# Patient Record
Sex: Female | Born: 1990 | Race: White | Hispanic: No | Marital: Married | Smoking: Never smoker
Health system: Southern US, Community
[De-identification: ages and names within clinical notes are randomized; demographics above are authoritative.]

## PROBLEM LIST (undated history)

## (undated) ENCOUNTER — Inpatient Hospital Stay (HOSPITAL_COMMUNITY): Payer: Self-pay

## (undated) DIAGNOSIS — E0590 Thyrotoxicosis, unspecified without thyrotoxic crisis or storm: Secondary | ICD-10-CM

## (undated) DIAGNOSIS — D649 Anemia, unspecified: Secondary | ICD-10-CM

## (undated) DIAGNOSIS — Z9889 Other specified postprocedural states: Secondary | ICD-10-CM

## (undated) DIAGNOSIS — F419 Anxiety disorder, unspecified: Secondary | ICD-10-CM

## (undated) DIAGNOSIS — E039 Hypothyroidism, unspecified: Secondary | ICD-10-CM

## (undated) DIAGNOSIS — B999 Unspecified infectious disease: Secondary | ICD-10-CM

## (undated) DIAGNOSIS — R112 Nausea with vomiting, unspecified: Secondary | ICD-10-CM

## (undated) DIAGNOSIS — R569 Unspecified convulsions: Secondary | ICD-10-CM

## (undated) DIAGNOSIS — F329 Major depressive disorder, single episode, unspecified: Secondary | ICD-10-CM

## (undated) DIAGNOSIS — N730 Acute parametritis and pelvic cellulitis: Secondary | ICD-10-CM

## (undated) DIAGNOSIS — M797 Fibromyalgia: Secondary | ICD-10-CM

## (undated) DIAGNOSIS — F32A Depression, unspecified: Secondary | ICD-10-CM

## (undated) DIAGNOSIS — IMO0002 Reserved for concepts with insufficient information to code with codable children: Secondary | ICD-10-CM

## (undated) HISTORY — PX: ROTATOR CUFF REPAIR: SHX139

## (undated) HISTORY — PX: DILATION AND CURETTAGE OF UTERUS: SHX78

## (undated) HISTORY — PX: TONSILLECTOMY: SUR1361

---

## 2012-02-18 NOTE — L&D Delivery Note (Signed)
Delivery Note  At 8:25 AM a viable femalenamed Rachel Bartlett  was delivered via Vaginal, Spontaneous Delivery (Presentation: Right Occiput Anterior).  APGAR: 9, 9 Placenta status: Intact, Spontaneous.  Cord: 3 vessels with the following complications: None.   Anesthesia: Epidural  Episiotomy: None Lacerations: None Est. Blood Loss (mL): 300  Mom to postpartum.  Baby to Couplet care / Skin to Skin.  [name] A 02/08/2013, 8:48 AM

## 2012-06-15 LAB — OB RESULTS CONSOLE RUBELLA ANTIBODY, IGM: Rubella: IMMUNE

## 2012-06-15 LAB — OB RESULTS CONSOLE RPR: RPR: NONREACTIVE

## 2012-06-15 LAB — OB RESULTS CONSOLE HEPATITIS B SURFACE ANTIGEN: Hepatitis B Surface Ag: NEGATIVE

## 2012-06-15 LAB — OB RESULTS CONSOLE HIV ANTIBODY (ROUTINE TESTING): HIV: NONREACTIVE

## 2012-06-15 LAB — OB RESULTS CONSOLE ANTIBODY SCREEN: Antibody Screen: NEGATIVE

## 2012-06-15 LAB — OB RESULTS CONSOLE GC/CHLAMYDIA: Chlamydia: NEGATIVE

## 2012-07-26 ENCOUNTER — Encounter (HOSPITAL_COMMUNITY): Payer: Self-pay

## 2012-07-26 ENCOUNTER — Inpatient Hospital Stay (HOSPITAL_COMMUNITY)
Admission: AD | Admit: 2012-07-26 | Discharge: 2012-07-26 | Disposition: A | Discharge status: Home / Self Care | Payer: Medicaid Other | Source: Ambulatory Visit | Attending: Obstetrics and Gynecology | Admitting: Obstetrics and Gynecology

## 2012-07-26 DIAGNOSIS — R197 Diarrhea, unspecified: Secondary | ICD-10-CM | POA: Insufficient documentation

## 2012-07-26 DIAGNOSIS — R51 Headache: Secondary | ICD-10-CM | POA: Insufficient documentation

## 2012-07-26 DIAGNOSIS — O210 Mild hyperemesis gravidarum: Secondary | ICD-10-CM | POA: Insufficient documentation

## 2012-07-26 DIAGNOSIS — K5900 Constipation, unspecified: Secondary | ICD-10-CM | POA: Insufficient documentation

## 2012-07-26 DIAGNOSIS — O99891 Other specified diseases and conditions complicating pregnancy: Secondary | ICD-10-CM | POA: Insufficient documentation

## 2012-07-26 HISTORY — DX: Reserved for concepts with insufficient information to code with codable children: IMO0002

## 2012-07-26 HISTORY — DX: Thyrotoxicosis, unspecified without thyrotoxic crisis or storm: E05.90

## 2012-07-26 HISTORY — DX: Anxiety disorder, unspecified: F41.9

## 2012-07-26 HISTORY — DX: Fibromyalgia: M79.7

## 2012-07-26 HISTORY — DX: Hypothyroidism, unspecified: E03.9

## 2012-07-26 HISTORY — DX: Anemia, unspecified: D64.9

## 2012-07-26 HISTORY — DX: Major depressive disorder, single episode, unspecified: F32.9

## 2012-07-26 HISTORY — DX: Depression, unspecified: F32.A

## 2012-07-26 HISTORY — DX: Acute parametritis and pelvic cellulitis: N73.0

## 2012-07-26 HISTORY — DX: Other specified postprocedural states: Z98.890

## 2012-07-26 HISTORY — DX: Unspecified infectious disease: B99.9

## 2012-07-26 HISTORY — DX: Unspecified convulsions: R56.9

## 2012-07-26 HISTORY — DX: Nausea with vomiting, unspecified: R11.2

## 2012-07-26 LAB — COMPREHENSIVE METABOLIC PANEL
ALT: 9 U/L (ref 0–35)
AST: 12 U/L (ref 0–37)
Albumin: 3.7 g/dL (ref 3.5–5.2)
Alkaline Phosphatase: 41 U/L (ref 39–117)
CO2: 24 mEq/L (ref 19–32)
Chloride: 103 mEq/L (ref 96–112)
Creatinine, Ser: 0.5 mg/dL (ref 0.50–1.10)
GFR calc non Af Amer: >90 mL/min (ref >90)
Potassium: 3.6 mEq/L (ref 3.5–5.1)
Total Bilirubin: 0.3 mg/dL (ref 0.3–1.2)

## 2012-07-26 LAB — URINALYSIS, ROUTINE W REFLEX MICROSCOPIC
Glucose, UA: NEGATIVE mg/dL
Nitrite: NEGATIVE
Protein, ur: NEGATIVE mg/dL
pH: 6.5 (ref 5.0–8.0)

## 2012-07-26 LAB — CBC WITH DIFFERENTIAL/PLATELET
Basophils Absolute: 0 10*3/uL (ref 0.0–0.1)
HCT: 35.9 % — ABNORMAL LOW (ref 36.0–46.0)
Hemoglobin: 12.9 g/dL (ref 12.0–15.0)
Lymphocytes Relative: 26 % (ref 12–46)
Monocytes Absolute: 0.5 10*3/uL (ref 0.1–1.0)
Neutro Abs: 5.3 10*3/uL (ref 1.7–7.7)
Neutrophils Relative %: 68 % (ref 43–77)
RDW: 12.6 % (ref 11.5–15.5)
WBC: 7.8 10*3/uL (ref 4.0–10.5)

## 2012-07-26 LAB — URINE MICROSCOPIC-ADD ON

## 2012-07-26 MED ORDER — ACETAMINOPHEN 325 MG PO TABS
650.0000 mg | ORAL_TABLET | Freq: Once | ORAL | Status: AC
  Administered 2012-07-26: 650 mg via ORAL
  Filled 2012-07-26: qty 2

## 2012-07-26 MED ORDER — ONDANSETRON HCL 4 MG/2ML IJ SOLN
4.0000 mg | Freq: Once | INTRAMUSCULAR | Status: AC
  Administered 2012-07-26: 4 mg via INTRAVENOUS
  Filled 2012-07-26: qty 2

## 2012-07-26 MED ORDER — LACTATED RINGERS IV BOLUS (SEPSIS)
500.0000 mL | Freq: Once | INTRAVENOUS | Status: AC
  Administered 2012-07-26: 18:00:00 via INTRAVENOUS

## 2012-07-26 MED ORDER — LACTATED RINGERS IV SOLN
INTRAVENOUS | Status: DC
  Administered 2012-07-26: 18:00:00 via INTRAVENOUS

## 2012-07-26 NOTE — MAU Provider Note (Signed)
History   22 yo G3P1011 at 41 5/7 weeks presented after calling office with N/V/D and HA since 07/24/12.  Hx of IBS and chronic constipation--on Miralax last week.  Denies fever or viral exposure.    Chief Complaint  Patient presents with  . Emesis  . Diarrhea  . Headache  . Abdominal Pain   OB History   Grav Para Term Preterm Abortions TAB SAB Ect Mult Living   3 1 1  0 1 0 1 0 0 1      Past Medical History  Diagnosis Date  . PONV (postoperative nausea and vomiting)   . Seizures     psuedo seizures  . Anemia   . Hyperthyroidism   . Hypothyroidism   . Infection     UTI  . Fibromyalgia   . Anxiety   . Depression   . PID (acute pelvic inflammatory disease)   . Abnormal Pap smear     HPV    Past Surgical History  Procedure Laterality Date  . Dilation and curettage of uterus    . Rotator cuff repair      right    Family History  Problem Relation Age of Onset  . Diabetes Maternal Grandmother   . Heart disease Maternal Grandmother   . Hyperlipidemia Maternal Grandmother   . Stroke Maternal Grandmother   . Hypertension Maternal Grandmother   . Cancer Maternal Grandmother     throat  . Cancer Maternal Grandfather     stomach  . Hyperlipidemia Maternal Grandfather   . Stroke Maternal Grandfather   . Dementia Maternal Grandfather     History  Substance Use Topics  . Smoking status: Never Smoker   . Smokeless tobacco: Never Used  . Alcohol Use: No    Allergies:  Allergies  Allergen Reactions  . Shellfish Allergy Anaphylaxis, Shortness Of Breath, Nausea And Vomiting and Swelling  . Celexa (Citalopram Hydrobromide) Hives, Swelling and Other (See Comments)    Causes hairloss  . Codeine Hives, Swelling and Other (See Comments)    Causes hairloss    Prescriptions prior to admission  Medication Sig Dispense Refill  . ondansetron (ZOFRAN-ODT) 4 MG disintegrating tablet Take 4-8 mg by mouth every 8 (eight) hours as needed for nausea.      . polyethylene glycol  (MIRALAX / GLYCOLAX) packet Take 17 g by mouth daily as needed (For constipation.).         Physical Exam   Blood pressure 112/64, pulse 110, temperature 98.9 F (37.2 C), temperature source Oral, resp. rate 16, height 5\' 6"  (1.676 m), weight 147 lb 3.2 oz (66.769 kg), SpO2 100.00%.  Chest clear  Heart RRR without murmur Abd gravid, NT Pelvic--deferred Ext WNL  FHR 165 by doppler.   ED Course  IUP at 11 5/7 weeks N/V/D, hx IBS  Plan: IV hydration Zofran CBC, diff, CMP UA Sanda Klein, CNM, will f/u on pt.   Nigel Bridgeman CNM, [place] 07/26/2012 6:46 PM

## 2012-07-26 NOTE — MAU Note (Signed)
Patient states she has had multiple episodes of nausea, vomiting and diarrhea since 6-7. Has been having all over abdominal pain and a headache. Denies bleeding or unusual discharge.

## 2012-07-28 LAB — URINE CULTURE
Colony Count: NO GROWTH
Culture: NO GROWTH

## 2012-09-08 ENCOUNTER — Encounter (HOSPITAL_COMMUNITY): Payer: Self-pay | Admitting: *Deleted

## 2012-09-08 ENCOUNTER — Inpatient Hospital Stay (HOSPITAL_COMMUNITY)
Admission: AD | Admit: 2012-09-08 | Discharge: 2012-09-09 | Disposition: A | Discharge status: Home / Self Care | Payer: Medicaid Other | Source: Ambulatory Visit | Attending: Obstetrics and Gynecology | Admitting: Obstetrics and Gynecology

## 2012-09-08 DIAGNOSIS — R51 Headache: Secondary | ICD-10-CM | POA: Insufficient documentation

## 2012-09-08 DIAGNOSIS — E860 Dehydration: Secondary | ICD-10-CM | POA: Insufficient documentation

## 2012-09-08 DIAGNOSIS — R509 Fever, unspecified: Secondary | ICD-10-CM | POA: Insufficient documentation

## 2012-09-08 DIAGNOSIS — O211 Hyperemesis gravidarum with metabolic disturbance: Secondary | ICD-10-CM | POA: Insufficient documentation

## 2012-09-08 LAB — URINALYSIS, ROUTINE W REFLEX MICROSCOPIC
Bilirubin Urine: NEGATIVE
Glucose, UA: NEGATIVE mg/dL
Hgb urine dipstick: NEGATIVE
Protein, ur: NEGATIVE mg/dL
Urobilinogen, UA: 0.2 mg/dL (ref 0.0–1.0)

## 2012-09-08 LAB — CBC WITH DIFFERENTIAL/PLATELET
Basophils Absolute: 0 10*3/uL (ref 0.0–0.1)
Eosinophils Absolute: 0 10*3/uL (ref 0.0–0.7)
Eosinophils Relative: 0 % (ref 0–5)
HCT: 35.1 % — ABNORMAL LOW (ref 36.0–46.0)
Lymphocytes Relative: 22 % (ref 12–46)
Lymphs Abs: 1.7 10*3/uL (ref 0.7–4.0)
MCH: 31.2 pg (ref 26.0–34.0)
MCV: 89.1 fL (ref 78.0–100.0)
Monocytes Absolute: 0.6 10*3/uL (ref 0.1–1.0)
Platelets: 189 10*3/uL (ref 150–400)
RDW: 12.9 % (ref 11.5–15.5)
WBC: 7.8 10*3/uL (ref 4.0–10.5)

## 2012-09-08 LAB — COMPREHENSIVE METABOLIC PANEL
CO2: 24 mEq/L (ref 19–32)
Calcium: 8.9 mg/dL (ref 8.4–10.5)
Creatinine, Ser: 0.5 mg/dL (ref 0.50–1.10)
GFR calc Af Amer: >90 mL/min (ref >90)
GFR calc non Af Amer: >90 mL/min (ref >90)
Glucose, Bld: 78 mg/dL (ref 70–99)
Sodium: 136 mEq/L (ref 135–145)
Total Protein: 5.5 g/dL — ABNORMAL LOW (ref 6.0–8.3)

## 2012-09-08 LAB — URINE MICROSCOPIC-ADD ON

## 2012-09-08 MED ORDER — PROMETHAZINE HCL 50 MG PO TABS: 25.0000 mg | ORAL_TABLET | Freq: Four times a day (QID) | ORAL | Status: DC | PRN

## 2012-09-08 MED ORDER — PROMETHAZINE HCL 25 MG PO TABS
25.0000 mg | ORAL_TABLET | Freq: Once | ORAL | Status: AC
  Administered 2012-09-08: 25 mg via ORAL
  Filled 2012-09-08: qty 1

## 2012-09-08 MED ORDER — IBUPROFEN 200 MG PO TABS: 600.0000 mg | ORAL_TABLET | Freq: Four times a day (QID) | ORAL | Status: DC | PRN

## 2012-09-08 MED ORDER — ONDANSETRON HCL 4 MG/2ML IJ SOLN
4.0000 mg | Freq: Once | INTRAMUSCULAR | Status: AC
  Administered 2012-09-08: 4 mg via INTRAVENOUS
  Filled 2012-09-08: qty 2

## 2012-09-08 MED ORDER — ACETAMINOPHEN 325 MG PO TABS
650.0000 mg | ORAL_TABLET | Freq: Once | ORAL | Status: AC
  Administered 2012-09-08: 650 mg via ORAL
  Filled 2012-09-08: qty 2

## 2012-09-08 MED ORDER — IBUPROFEN 800 MG PO TABS
800.0000 mg | ORAL_TABLET | Freq: Once | ORAL | Status: AC
  Administered 2012-09-08: 800 mg via ORAL
  Filled 2012-09-08 (×2): qty 1

## 2012-09-08 MED ORDER — LACTATED RINGERS IV BOLUS (SEPSIS)
1000.0000 mL | Freq: Once | INTRAVENOUS | Status: AC
  Administered 2012-09-08: 1000 mL via INTRAVENOUS

## 2012-09-08 NOTE — MAU Note (Signed)
Pt presents with complaints of a fever, headache, dizziness, nausea and vomiting on and off for a couple of weeks. Pt denies any bleeding or discharge.

## 2012-09-08 NOTE — MAU Provider Note (Signed)
History     CSN: [id number]  Arrival date and time: 09/08/12 1923   None     Chief Complaint  Patient presents with  . Fever  . Nausea  . Emesis  . Dizziness  . Headache   HPI Comments: Pt is a 21yo G3P1011 at 18wks that arrives to MAU unannounced w multiple c/o, including, N/V, headache, dizziness, back pain, shoulder pain. Denies any cramping or ctx, no bleeding or LOF, no discharge, no dysuria, has infrequent BM's (hx IBS), last BM 2-3 days ago. Last ate today around noon, had half a piece of pizza, and vomited, has been using zofran infrequently at home. Tried tylenol once yesterday. States she's had the back pain and and shoulder pain x2wks, (hx shoulder reconstruction) Reports she had a fever today that was 101. No further recurrence of fever. States ppl at work have been sick, but she's not sure with what. (works at [employer]).     Fever  This is a new problem. The current episode started today. The problem has been resolved. The maximum temperature noted was 101 to 101.9 F. The temperature was taken using an oral thermometer. Associated symptoms include headaches, nausea and vomiting. Pertinent negatives include no abdominal pain, chest pain, congestion, coughing, diarrhea, ear pain, muscle aches, rash, sore throat, urinary pain or wheezing. She has tried acetaminophen for the symptoms. Improvement on treatment: fever resolved   Emesis  This is a recurrent problem. The current episode started in the past 7 days. The problem occurs 2 to 4 times per day. The problem has been waxing and waning. The emesis has an appearance of stomach contents. The maximum temperature recorded prior to her arrival was 101 - 101.9 F. The fever has been present for less than 1 day. Associated symptoms include dizziness, a fever and headaches. Pertinent negatives include no abdominal pain, arthralgias, chest pain, chills, coughing, diarrhea, myalgias, sweats, URI or weight loss. Risk factors include ill  contacts (around persons w ?URI, etc ). Treatments tried: zofran. The treatment provided no relief.  Headache  This is a new problem. The current episode started yesterday. The problem occurs constantly. The problem has been waxing and waning. The pain does not radiate. The pain quality is similar to prior headaches. The pain is moderate. Associated symptoms include back pain, dizziness, a fever, nausea and vomiting. Pertinent negatives include no abdominal pain, abnormal behavior, anorexia, blurred vision, coughing, drainage, ear pain, eye pain, eye redness, eye watering, facial sweating, hearing loss, insomnia, loss of balance, muscle aches, neck pain, numbness, phonophobia, photophobia, rhinorrhea, scalp tenderness, seizures, sinus pressure, sore throat, swollen glands, tingling, tinnitus, visual change, weakness or weight loss. Nothing aggravates the symptoms. She has tried acetaminophen for the symptoms. The treatment provided no relief. There is no history of cancer, cluster headaches, hypertension, immunosuppression, migraine headaches, migraines in the family, obesity, pseudotumor cerebri, recent head traumas, sinus disease or TMJ.      Past Medical History  Diagnosis Date  . PONV (postoperative nausea and vomiting)   . Seizures     psuedo seizures  . Anemia   . Hyperthyroidism   . Hypothyroidism   . Infection     UTI  . Fibromyalgia   . Anxiety   . Depression   . PID (acute pelvic inflammatory disease)   . Abnormal Pap smear     HPV    Past Surgical History  Procedure Laterality Date  . Dilation and curettage of uterus    . Rotator cuff repair  right    Family History  Problem Relation Age of Onset  . Diabetes Maternal Grandmother   . Heart disease Maternal Grandmother   . Hyperlipidemia Maternal Grandmother   . Stroke Maternal Grandmother   . Hypertension Maternal Grandmother   . Cancer Maternal Grandmother     throat  . Cancer Maternal Grandfather     stomach   . Hyperlipidemia Maternal Grandfather   . Stroke Maternal Grandfather   . Dementia Maternal Grandfather     History  Substance Use Topics  . Smoking status: Never Smoker   . Smokeless tobacco: Never Used  . Alcohol Use: No    Allergies:  Allergies  Allergen Reactions  . Shellfish Allergy Anaphylaxis, Shortness Of Breath, Nausea And Vomiting and Swelling  . Celexa (Citalopram Hydrobromide) Hives, Swelling and Other (See Comments)    Causes hairloss  . Codeine Hives, Swelling and Other (See Comments)    Causes hairloss    Prescriptions prior to admission  Medication Sig Dispense Refill  . ondansetron (ZOFRAN-ODT) 4 MG disintegrating tablet Take 4-8 mg by mouth every 8 (eight) hours as needed for nausea.      . polyethylene glycol (MIRALAX / GLYCOLAX) packet Take 17 g by mouth daily as needed (For constipation.).        Review of Systems  Constitutional: Positive for fever. Negative for chills and weight loss.  HENT: Negative for hearing loss, ear pain, congestion, sore throat, rhinorrhea, neck pain, sinus pressure and tinnitus.   Eyes: Negative for blurred vision, photophobia, pain and redness.  Respiratory: Negative for cough and wheezing.   Cardiovascular: Negative for chest pain.  Gastrointestinal: Positive for nausea, vomiting and constipation. Negative for abdominal pain, diarrhea and anorexia.  Genitourinary: Negative for dysuria, urgency, frequency and flank pain.  Musculoskeletal: Positive for back pain. Negative for myalgias and arthralgias.  Skin: Negative for rash.  Neurological: Positive for dizziness and headaches. Negative for tingling, seizures, loss of consciousness, weakness, numbness and loss of balance.  Psychiatric/Behavioral: The patient does not have insomnia.    Physical Exam   Blood pressure 108/68, pulse 104, temperature 98.1 F (36.7 C), temperature source Oral, resp. rate 18.  Physical Exam  Nursing note and vitals reviewed. Constitutional:  She is oriented to person, place, and time. She appears well-developed and well-nourished. No distress.  HENT:  Head: Normocephalic.  Eyes: Pupils are equal, round, and reactive to light.  Neck: Normal range of motion.  Cardiovascular: Normal rate.   Respiratory: Effort normal.  GI: Soft.  Genitourinary:  Deferred   Musculoskeletal: Normal range of motion.  Neurological: She is alert and oriented to person, place, and time. She has normal reflexes.  Skin: Skin is warm and dry.  Psychiatric: She has a normal mood and affect. Her behavior is normal.   FHT's 145 per doppler  Results for orders placed during the hospital encounter of 09/08/12 (from the past 24 hour(s))  URINALYSIS, ROUTINE W REFLEX MICROSCOPIC     Status: Abnormal   Collection Time    09/08/12  7:15 PM      Result Value Range   Color, Urine YELLOW  YELLOW   APPearance CLEAR  CLEAR   Specific Gravity, Urine >1.030 (*) 1.005 - 1.030   pH 6.0  5.0 - 8.0   Glucose, UA NEGATIVE  NEGATIVE mg/dL   Hgb urine dipstick NEGATIVE  NEGATIVE   Bilirubin Urine NEGATIVE  NEGATIVE   Ketones, ur NEGATIVE  NEGATIVE mg/dL   Protein, ur NEGATIVE  NEGATIVE mg/dL  Urobilinogen, UA 0.2  0.0 - 1.0 mg/dL   Nitrite NEGATIVE  NEGATIVE   Leukocytes, UA SMALL (*) NEGATIVE  URINE MICROSCOPIC-ADD ON     Status: Abnormal   Collection Time    09/08/12  7:15 PM      Result Value Range   Squamous Epithelial / LPF FEW (*) RARE   WBC, UA 7-10  <3 WBC/hpf   RBC / HPF 0-2  <3 RBC/hpf   Bacteria, UA FEW (*) RARE  CBC WITH DIFFERENTIAL     Status: Abnormal   Collection Time    09/08/12  8:15 PM      Result Value Range   WBC 7.8  4.0 - 10.5 K/uL   RBC 3.94  3.87 - 5.11 MIL/uL   Hemoglobin 12.3  12.0 - 15.0 g/dL   HCT 95.2 (*) 84.1 - 32.4 %   MCV 89.1  78.0 - 100.0 fL   MCH 31.2  26.0 - 34.0 pg   MCHC 35.0  30.0 - 36.0 g/dL   RDW 40.1  02.7 - 25.3 %   Platelets 189  150 - 400 K/uL   Neutrophils Relative % 69  43 - 77 %   Neutro Abs 5.4  1.7  - 7.7 K/uL   Lymphocytes Relative 22  12 - 46 %   Lymphs Abs 1.7  0.7 - 4.0 K/uL   Monocytes Relative 8  3 - 12 %   Monocytes Absolute 0.6  0.1 - 1.0 K/uL   Eosinophils Relative 0  0 - 5 %   Eosinophils Absolute 0.0  0.0 - 0.7 K/uL   Basophils Relative 0  0 - 1 %   Basophils Absolute 0.0  0.0 - 0.1 K/uL  COMPREHENSIVE METABOLIC PANEL     Status: Abnormal   Collection Time    09/08/12  8:30 PM      Result Value Range   Sodium 136  135 - 145 mEq/L   Potassium 3.5  3.5 - 5.1 mEq/L   Chloride 102  96 - 112 mEq/L   CO2 24  19 - 32 mEq/L   Glucose, Bld 78  70 - 99 mg/dL   BUN 7  6 - 23 mg/dL   Creatinine, Ser 6.64  0.50 - 1.10 mg/dL   Calcium 8.9  8.4 - 40.3 mg/dL   Total Protein 5.5 (*) 6.0 - 8.3 g/dL   Albumin 2.9 (*) 3.5 - 5.2 g/dL   AST 10  0 - 37 U/L   ALT 6  0 - 35 U/L   Alkaline Phosphatase 35 (*) 39 - 117 U/L   Total Bilirubin 0.2 (*) 0.3 - 1.2 mg/dL   GFR calc non Af Amer >90  >90 mL/min   GFR calc Af Amer >90  >90 mL/min      MAU Course  Procedures IVF's zofran IV CBC CMET UA Tylenol and motrin PO  Phenergan PO    Assessment and Plan  IUP at 18wks Dehydration from persistent N/V, nausea improved after IV zofran  UA sent for cx, though appears to be a dirty specimen  Labs otherwise normal  Will give a  Dose of tylenol and motrin, if headache improved will dc home w rx for motrin to take for 48hrs ATC, headache likely from dehydration and likely hypoglycemia  rv'd hyperemesis diet  Will dc home w rx for phenergan (pt states it has helped her in the past and w previous pregnancy) Continue to take zofran at home PRN Recommended heating pad  for back and/or salonpas patches, also soaking in bath with epsom salts dc'd home in stable condition  rx motrin 600mg  q6h ATC x48hrs, then prn, #30 0RF rx phenergan 25mg  q6hprn #30 0RF   Rachel Bartlett 09/08/2012, 9:57 PM

## 2012-09-11 LAB — URINE CULTURE

## 2012-12-02 ENCOUNTER — Other Ambulatory Visit: Payer: Self-pay | Admitting: Obstetrics and Gynecology

## 2012-12-02 DIAGNOSIS — G8929 Other chronic pain: Secondary | ICD-10-CM

## 2012-12-06 ENCOUNTER — Ambulatory Visit
Admission: RE | Admit: 2012-12-06 | Discharge: 2012-12-06 | Disposition: A | Discharge status: Home / Self Care | Payer: Medicaid Other | Source: Ambulatory Visit | Attending: Obstetrics and Gynecology | Admitting: Obstetrics and Gynecology

## 2012-12-06 DIAGNOSIS — G8929 Other chronic pain: Secondary | ICD-10-CM

## 2012-12-17 ENCOUNTER — Encounter (HOSPITAL_COMMUNITY): Payer: Self-pay | Admitting: *Deleted

## 2012-12-17 ENCOUNTER — Inpatient Hospital Stay (HOSPITAL_COMMUNITY)
Admission: AD | Admit: 2012-12-17 | Discharge: 2012-12-17 | Disposition: A | Discharge status: Home / Self Care | Payer: Medicaid Other | Source: Ambulatory Visit | Attending: Obstetrics and Gynecology | Admitting: Obstetrics and Gynecology

## 2012-12-17 DIAGNOSIS — R51 Headache: Secondary | ICD-10-CM | POA: Insufficient documentation

## 2012-12-17 DIAGNOSIS — O368190 Decreased fetal movements, unspecified trimester, not applicable or unspecified: Secondary | ICD-10-CM | POA: Insufficient documentation

## 2012-12-17 LAB — URINALYSIS, ROUTINE W REFLEX MICROSCOPIC
Glucose, UA: NEGATIVE mg/dL
Hgb urine dipstick: NEGATIVE
Ketones, ur: NEGATIVE mg/dL
Leukocytes, UA: NEGATIVE
Protein, ur: NEGATIVE mg/dL
Specific Gravity, Urine: 1.025 (ref 1.005–1.030)
pH: 6 (ref 5.0–8.0)

## 2012-12-17 NOTE — MAU Note (Signed)
Decreased FM all day. Nausea. Headache.

## 2013-02-08 ENCOUNTER — Inpatient Hospital Stay (HOSPITAL_COMMUNITY): Payer: Medicaid Other | Admitting: Anesthesiology

## 2013-02-08 ENCOUNTER — Inpatient Hospital Stay (HOSPITAL_COMMUNITY)
Admission: AD | Admit: 2013-02-08 | Discharge: 2013-02-09 | DRG: 775 | Disposition: A | Discharge status: Home / Self Care | Payer: Medicaid Other | Source: Ambulatory Visit | Attending: Obstetrics and Gynecology | Admitting: Obstetrics and Gynecology

## 2013-02-08 ENCOUNTER — Encounter (HOSPITAL_COMMUNITY): Payer: Medicaid Other | Admitting: Anesthesiology

## 2013-02-08 ENCOUNTER — Encounter (HOSPITAL_COMMUNITY): Payer: Self-pay | Admitting: *Deleted

## 2013-02-08 DIAGNOSIS — D649 Anemia, unspecified: Secondary | ICD-10-CM | POA: Diagnosis not present

## 2013-02-08 DIAGNOSIS — IMO0001 Reserved for inherently not codable concepts without codable children: Secondary | ICD-10-CM

## 2013-02-08 DIAGNOSIS — O99892 Other specified diseases and conditions complicating childbirth: Secondary | ICD-10-CM | POA: Diagnosis present

## 2013-02-08 DIAGNOSIS — G8929 Other chronic pain: Secondary | ICD-10-CM | POA: Insufficient documentation

## 2013-02-08 DIAGNOSIS — Z6791 Unspecified blood type, Rh negative: Secondary | ICD-10-CM | POA: Diagnosis present

## 2013-02-08 DIAGNOSIS — K589 Irritable bowel syndrome without diarrhea: Secondary | ICD-10-CM | POA: Insufficient documentation

## 2013-02-08 DIAGNOSIS — O360990 Maternal care for other rhesus isoimmunization, unspecified trimester, not applicable or unspecified: Principal | ICD-10-CM | POA: Diagnosis present

## 2013-02-08 DIAGNOSIS — O9903 Anemia complicating the puerperium: Secondary | ICD-10-CM | POA: Diagnosis not present

## 2013-02-08 LAB — CBC
HCT: 31.1 % — ABNORMAL LOW (ref 36.0–46.0)
MCH: 29.5 pg (ref 26.0–34.0)
MCHC: 34.1 g/dL (ref 30.0–36.0)
MCV: 86.6 fL (ref 78.0–100.0)
RBC: 3.59 MIL/uL — ABNORMAL LOW (ref 3.87–5.11)
RDW: 12.7 % (ref 11.5–15.5)

## 2013-02-08 MED ORDER — OXYTOCIN 40 UNITS IN LACTATED RINGERS INFUSION - SIMPLE MED
62.5000 mL/h | INTRAVENOUS | Status: DC
  Filled 2013-02-08: qty 1000

## 2013-02-08 MED ORDER — BENZOCAINE-MENTHOL 20-0.5 % EX AERO
1.0000 "application " | INHALATION_SPRAY | CUTANEOUS | Status: DC | PRN
  Administered 2013-02-08: 1 via TOPICAL
  Filled 2013-02-08: qty 56

## 2013-02-08 MED ORDER — EPHEDRINE 5 MG/ML INJ
10.0000 mg | INTRAVENOUS | Status: DC | PRN
  Filled 2013-02-08: qty 2

## 2013-02-08 MED ORDER — IBUPROFEN 600 MG PO TABS
600.0000 mg | ORAL_TABLET | Freq: Four times a day (QID) | ORAL | Status: DC | PRN
  Administered 2013-02-08 – 2013-02-09 (×2): 600 mg via ORAL
  Filled 2013-02-08: qty 1

## 2013-02-08 MED ORDER — PHENYLEPHRINE 40 MCG/ML (10ML) SYRINGE FOR IV PUSH (FOR BLOOD PRESSURE SUPPORT)
80.0000 ug | PREFILLED_SYRINGE | INTRAVENOUS | Status: DC | PRN
  Filled 2013-02-08: qty 2

## 2013-02-08 MED ORDER — LANOLIN HYDROUS EX OINT: TOPICAL_OINTMENT | CUTANEOUS | Status: DC | PRN

## 2013-02-08 MED ORDER — DIPHENHYDRAMINE HCL 50 MG/ML IJ SOLN: 12.5000 mg | INTRAMUSCULAR | Status: DC | PRN

## 2013-02-08 MED ORDER — CITRIC ACID-SODIUM CITRATE 334-500 MG/5ML PO SOLN: 30.0000 mL | ORAL | Status: DC | PRN

## 2013-02-08 MED ORDER — PRENATAL MULTIVITAMIN CH
1.0000 | ORAL_TABLET | Freq: Every day | ORAL | Status: DC
  Administered 2013-02-08 – 2013-02-09 (×2): 1 via ORAL
  Filled 2013-02-08 (×2): qty 1

## 2013-02-08 MED ORDER — TETANUS-DIPHTH-ACELL PERTUSSIS 5-2.5-18.5 LF-MCG/0.5 IM SUSP: 0.5000 mL | Freq: Once | INTRAMUSCULAR | Status: DC

## 2013-02-08 MED ORDER — DIBUCAINE 1 % RE OINT: 1.0000 "application " | TOPICAL_OINTMENT | RECTAL | Status: DC | PRN

## 2013-02-08 MED ORDER — BUPIVACAINE HCL (PF) 0.25 % IJ SOLN
INTRAMUSCULAR | Status: DC | PRN
  Administered 2013-02-08: 10 mL via EPIDURAL

## 2013-02-08 MED ORDER — IBUPROFEN 600 MG PO TABS
600.0000 mg | ORAL_TABLET | Freq: Four times a day (QID) | ORAL | Status: DC
  Administered 2013-02-08 – 2013-02-09 (×3): 600 mg via ORAL
  Filled 2013-02-08 (×4): qty 1

## 2013-02-08 MED ORDER — FENTANYL 2.5 MCG/ML BUPIVACAINE 1/10 % EPIDURAL INFUSION (WH - ANES)
14.0000 mL/h | INTRAMUSCULAR | Status: DC | PRN
  Administered 2013-02-08: 14 mL/h via EPIDURAL
  Filled 2013-02-08 (×2): qty 125

## 2013-02-08 MED ORDER — LACTATED RINGERS IV SOLN
INTRAVENOUS | Status: DC
  Administered 2013-02-08: 05:00:00 via INTRAVENOUS

## 2013-02-08 MED ORDER — ONDANSETRON HCL 4 MG/2ML IJ SOLN: 4.0000 mg | INTRAMUSCULAR | Status: DC | PRN

## 2013-02-08 MED ORDER — EPHEDRINE 5 MG/ML INJ
10.0000 mg | INTRAVENOUS | Status: DC | PRN
  Filled 2013-02-08: qty 2
  Filled 2013-02-08: qty 4

## 2013-02-08 MED ORDER — OXYCODONE-ACETAMINOPHEN 5-325 MG PO TABS
1.0000 | ORAL_TABLET | ORAL | Status: DC | PRN
  Administered 2013-02-08: 1 via ORAL
  Administered 2013-02-08 – 2013-02-09 (×2): 2 via ORAL
  Filled 2013-02-08 (×2): qty 2
  Filled 2013-02-08: qty 1
  Filled 2013-02-08: qty 2

## 2013-02-08 MED ORDER — PHENYLEPHRINE 40 MCG/ML (10ML) SYRINGE FOR IV PUSH (FOR BLOOD PRESSURE SUPPORT)
80.0000 ug | PREFILLED_SYRINGE | INTRAVENOUS | Status: DC | PRN
  Filled 2013-02-08: qty 2
  Filled 2013-02-08: qty 10

## 2013-02-08 MED ORDER — LACTATED RINGERS IV SOLN: 500.0000 mL | INTRAVENOUS | Status: DC | PRN

## 2013-02-08 MED ORDER — LIDOCAINE HCL (PF) 1 % IJ SOLN
INTRAMUSCULAR | Status: DC | PRN
  Administered 2013-02-08: 8 mL
  Administered 2013-02-08: 9 mL

## 2013-02-08 MED ORDER — LIDOCAINE-EPINEPHRINE 2 %-1:100000 IJ SOLN
INTRAMUSCULAR | Status: DC | PRN
  Administered 2013-02-08: 4 mL via INTRADERMAL
  Administered 2013-02-08: 2 mL via INTRADERMAL
  Administered 2013-02-08: 4 mL via INTRADERMAL

## 2013-02-08 MED ORDER — FERROUS SULFATE 325 (65 FE) MG PO TABS
325.0000 mg | ORAL_TABLET | Freq: Two times a day (BID) | ORAL | Status: DC
  Administered 2013-02-08 – 2013-02-09 (×2): 325 mg via ORAL
  Filled 2013-02-08 (×2): qty 1

## 2013-02-08 MED ORDER — ONDANSETRON HCL 4 MG/2ML IJ SOLN
4.0000 mg | Freq: Four times a day (QID) | INTRAMUSCULAR | Status: DC | PRN
  Administered 2013-02-08: 4 mg via INTRAVENOUS
  Filled 2013-02-08: qty 2

## 2013-02-08 MED ORDER — ONDANSETRON HCL 4 MG PO TABS: 4.0000 mg | ORAL_TABLET | ORAL | Status: DC | PRN

## 2013-02-08 MED ORDER — ZOLPIDEM TARTRATE 5 MG PO TABS: 5.0000 mg | ORAL_TABLET | Freq: Every evening | ORAL | Status: DC | PRN

## 2013-02-08 MED ORDER — LACTATED RINGERS IV SOLN: 500.0000 mL | Freq: Once | INTRAVENOUS | Status: DC

## 2013-02-08 MED ORDER — ACETAMINOPHEN 325 MG PO TABS: 650.0000 mg | ORAL_TABLET | ORAL | Status: DC | PRN

## 2013-02-08 MED ORDER — OXYTOCIN BOLUS FROM INFUSION
500.0000 mL | INTRAVENOUS | Status: DC
  Administered 2013-02-08: 500 mL via INTRAVENOUS

## 2013-02-08 MED ORDER — DIPHENHYDRAMINE HCL 25 MG PO CAPS: 25.0000 mg | ORAL_CAPSULE | Freq: Four times a day (QID) | ORAL | Status: DC | PRN

## 2013-02-08 MED ORDER — SIMETHICONE 80 MG PO CHEW: 80.0000 mg | CHEWABLE_TABLET | ORAL | Status: DC | PRN

## 2013-02-08 MED ORDER — MEASLES, MUMPS & RUBELLA VAC ~~LOC~~ INJ
0.5000 mL | INJECTION | Freq: Once | SUBCUTANEOUS | Status: DC
  Filled 2013-02-08: qty 0.5

## 2013-02-08 MED ORDER — LIDOCAINE HCL (PF) 1 % IJ SOLN
30.0000 mL | INTRAMUSCULAR | Status: DC | PRN
  Filled 2013-02-08: qty 30

## 2013-02-08 MED ORDER — WITCH HAZEL-GLYCERIN EX PADS: 1.0000 "application " | MEDICATED_PAD | CUTANEOUS | Status: DC | PRN

## 2013-02-08 MED ORDER — SENNOSIDES-DOCUSATE SODIUM 8.6-50 MG PO TABS
2.0000 | ORAL_TABLET | ORAL | Status: DC
  Administered 2013-02-09: 2 via ORAL
  Filled 2013-02-08: qty 2

## 2013-02-08 MED ORDER — FENTANYL 2.5 MCG/ML BUPIVACAINE 1/10 % EPIDURAL INFUSION (WH - ANES)
INTRAMUSCULAR | Status: DC | PRN
  Administered 2013-02-08: 14 mL/h via EPIDURAL

## 2013-02-08 NOTE — Progress Notes (Signed)
  Subjective: Pt is comfortable with epidural.  Objective: BP 116/60  Pulse 86  Temp(Src) 98.5 F (36.9 C) (Oral)  Resp 16  Ht 5\' 8"  (1.727 m)  Wt 180 lb (81.647 kg)  BMI 27.38 kg/m2  SpO2 97%      FHT:  Cat I UC:   regular, every 3-4 minutes  SVE:   Dilation: 8 Effacement (%): 100 Station: -1 Exam by:: J. Ouita Nish CNM  Assessment / Plan:  Labor: Active labor Preeclampsia: no s/s Fetal Wellbeing: Cat I Pain Control: Epidural I/D: GBS neg; Intact; Afebrile Anticipated MOD: SVD   Jolynn Bajorek 02/08/2013, 6:05 AM

## 2013-02-08 NOTE — Progress Notes (Signed)
Pt. States can take percocet w/o difficulties. Mainly allergic to Tylenol #3. Dr. Estanislado Pandy notified of pt. Request for pain medication.

## 2013-02-08 NOTE — Anesthesia Procedure Notes (Signed)
Epidural Patient location during procedure: OB Start time: 02/08/2013 2:18 AM End time: 02/08/2013 2:22 AM  Staffing Anesthesiologist: Leilani Able Performed by: anesthesiologist   Preanesthetic Checklist Completed: patient identified, surgical consent, pre-op evaluation, timeout performed, IV checked, risks and benefits discussed and monitors and equipment checked  Epidural Patient position: sitting Prep: site prepped and draped and DuraPrep Patient monitoring: continuous pulse ox and blood pressure Approach: midline Injection technique: LOR air  Needle:  Needle type: Tuohy  Needle gauge: 17 G Needle length: 9 cm and 9 Needle insertion depth: 5 cm cm Catheter type: closed end flexible Catheter size: 19 Gauge Catheter at skin depth: 10 cm Test dose: negative and Other  Assessment Sensory level: T9 Events: blood not aspirated, injection not painful, no injection resistance, negative IV test and no paresthesia  Additional Notes Reason for block:procedure for pain

## 2013-02-08 NOTE — Anesthesia Preprocedure Evaluation (Signed)
Anesthesia Evaluation  Patient identified by MRN, date of birth, ID band Patient awake    Reviewed: Allergy & Precautions, H&P , NPO status , Patient's Chart, lab work & pertinent test results  Airway Mallampati: I TM Distance: >3 FB Neck ROM: full    Dental no notable dental hx.    Pulmonary neg pulmonary ROS,    Pulmonary exam normal       Cardiovascular negative cardio ROS      Neuro/Psych    GI/Hepatic negative GI ROS, Neg liver ROS,   Endo/Other    Renal/GU negative Renal ROS     Musculoskeletal   Abdominal Normal abdominal exam  (+)   Peds  Hematology negative hematology ROS (+)   Anesthesia Other Findings   Reproductive/Obstetrics (+) Pregnancy                           Anesthesia Physical Anesthesia Plan  ASA: II  Anesthesia Plan: Epidural   Post-op Pain Management:    Induction:   Airway Management Planned:   Additional Equipment:   Intra-op Plan:   Post-operative Plan:   Informed Consent: I have reviewed the patients History and Physical, chart, labs and discussed the procedure including the risks, benefits and alternatives for the proposed anesthesia with the patient or authorized representative who has indicated his/her understanding and acceptance.     Plan Discussed with:   Anesthesia Plan Comments:         Anesthesia Quick Evaluation

## 2013-02-08 NOTE — Progress Notes (Signed)
UR completed 

## 2013-02-08 NOTE — MAU Note (Signed)
Pt presents with complaint of contractions.  

## 2013-02-08 NOTE — H&P (Signed)
Rachel Bartlett is a 22 y.o. female, G3P1011 at [redacted]w[redacted]d, presenting for UCs.  Membranes swept today in office.  H/o quick dilation with 1st child.  Denies VB, LOF, recent fever, resp or GI c/o's, UTI or PIH s/s. GFM. Desires epidural.  Patient Active Problem List   Diagnosis Date Noted  . IBS (irritable bowel syndrome) 02/08/2013  . Chronic back pain 02/08/2013  . Blood type, Rh negative 02/08/2013    History of present pregnancy: Patient entered care at 5 weeks.   EDC of 02/09/13 was established by LMP.   Anatomy scan:  19 weeks, with normal findings and an anterior placenta.   Additional [place] evaluations:  Right kidney d/t chronic back pain - nonspecific right side hydronephrosis with preserded bilateral ureteral jets.   Significant prenatal events:  Chronic constipation and back pain - sent to PT and to urology   Last evaluation:  02/07/13 at [redacted]w[redacted]d   2 cm / 50% / -2  OB History   Grav Para Term Preterm Abortions TAB SAB Ect Mult Living   3 1 1  0 1 0 1 0 0 1     Past Medical History  Diagnosis Date  . PONV (postoperative nausea and vomiting)   . Seizures     psuedo seizures  . Anemia   . Hyperthyroidism   . Hypothyroidism   . Infection     UTI  . Fibromyalgia   . Anxiety   . Depression   . PID (acute pelvic inflammatory disease)   . Abnormal Pap smear     HPV   Past Surgical History  Procedure Laterality Date  . Dilation and curettage of uterus    . Rotator cuff repair      right   Family History: family history includes Cancer in her maternal grandfather and maternal grandmother; Dementia in her maternal grandfather; Diabetes in her maternal grandmother; Heart disease in her maternal grandmother; Hyperlipidemia in her maternal grandfather and maternal grandmother; Hypertension in her maternal grandmother; Stroke in her maternal grandfather and maternal grandmother. Social History:  reports that she has never smoked. She has never used smokeless tobacco. She reports that  she does not drink alcohol or use illicit drugs.   Prenatal Transfer Tool  Maternal Diabetes: No Genetic Screening: Normal Maternal Ultrasounds/Referrals: Normal Fetal Ultrasounds or other Referrals:  None Maternal Substance Abuse:  No Significant Maternal Medications:  None Significant Maternal Lab Results: Lab values include: Group B Strep negative    ROS: see HPI above, all other systems are negative   Allergies  Allergen Reactions  . Shellfish Allergy Anaphylaxis, Shortness Of Breath, Nausea And Vomiting and Swelling  . Celexa [Citalopram Hydrobromide] Hives, Swelling and Other (See Comments)    Causes hairloss  . Codeine Hives, Swelling and Other (See Comments)    Causes hairloss     Dilation: 6.5 Effacement (%): 100 Station: -2 Exam by:: Laconda Basich,CNM Blood pressure 135/70, pulse 95, resp. rate 18, height 5\' 8"  (1.727 m), weight 180 lb (81.647 kg), SpO2 100.00%.  Chest clear Heart RRR without murmur Abd gravid, NT Ext: WNL  FHR: Cat I UCs:  Q 2-3 min  Prenatal labs: ABO, Rh:  AB neg Antibody:  Neg Rubella:   Immune RPR:   Neg HBsAg:   Neg HIV:   Neg GBS:  Neg Sickle cell/Hgb electrophoresis:  n/a Pap:  10/27/12 Neg GC:  Neg Chlamydia:  Neg Genetic screenings:  1st and 2nd trimester screen - neg Glucola:  103 Other:  n/a  Assessment/Plan: IUP at [redacted]w[redacted]d Active labor GBS neg Rh neg H/o rapid dilation Desires epidural  Admit to BS per c/w Dr. Stefano Gaul as attending MD Routine [facility] orders Epidural once at [facility] after delivery if [facility].  Rowan Blase, MSN 02/08/2013, 1:49 AM

## 2013-02-09 DIAGNOSIS — D649 Anemia, unspecified: Secondary | ICD-10-CM | POA: Diagnosis present

## 2013-02-09 LAB — CBC
HCT: 29.4 % — ABNORMAL LOW (ref 36.0–46.0)
MCH: 29.9 pg (ref 26.0–34.0)
MCHC: 34 g/dL (ref 30.0–36.0)
MCV: 88 fL (ref 78.0–100.0)
Platelets: 161 10*3/uL (ref 150–400)
RBC: 3.34 MIL/uL — ABNORMAL LOW (ref 3.87–5.11)
RDW: 12.9 % (ref 11.5–15.5)

## 2013-02-09 MED ORDER — RHO D IMMUNE GLOBULIN 1500 UNIT/2ML IJ SOLN
300.0000 ug | Freq: Once | INTRAMUSCULAR | Status: AC
  Administered 2013-02-09: 300 ug via INTRAMUSCULAR
  Filled 2013-02-09: qty 2

## 2013-02-09 MED ORDER — IBUPROFEN 600 MG PO TABS: 600.0000 mg | ORAL_TABLET | Freq: Four times a day (QID) | ORAL | Status: DC | PRN

## 2013-02-09 MED ORDER — OXYCODONE-ACETAMINOPHEN 5-325 MG PO TABS: 1.0000 | ORAL_TABLET | ORAL | Status: DC | PRN

## 2013-02-09 MED ORDER — FERROUS SULFATE 325 (65 FE) MG PO TBEC: 325.0000 mg | DELAYED_RELEASE_TABLET | Freq: Three times a day (TID) | ORAL | Status: DC

## 2013-02-09 MED ORDER — NORETHINDRONE 0.35 MG PO TABS: 1.0000 | ORAL_TABLET | Freq: Every day | ORAL | Status: DC

## 2013-02-09 NOTE — Progress Notes (Signed)
Patient was referred for history of depression/anxiety. * Referral screened out by Clinical Social Worker because none of the following criteria appear to apply:  ~ History of anxiety/depression during this pregnancy, or of post-partum depression.  ~ Diagnosis of anxiety and/or depression within last 3 years (as a child), per chart review.  ~ History of depression due to pregnancy loss/loss of child  OR * Patient's symptoms currently being treated with medication and/or therapy.  Please contact the Clinical Social Worker if needs arise, or by the patient's request.

## 2013-02-09 NOTE — Discharge Summary (Signed)
Obstetric Discharge Summary Reason for Admission: onset of labor Prenatal Procedures: none Intrapartum Procedures: spontaneous vaginal delivery Postpartum Procedures: none Complications-Operative and Postpartum: none Hemoglobin  Date Value Range Status  02/09/2013 10.0* 12.0 - 15.0 g/dL Final     HCT  Date Value Range Status  02/09/2013 29.4* 36.0 - 46.0 % Final   BP 123/73  Pulse 86  Temp(Src) 97.7 F (36.5 C) (Oral)  Resp 18  Ht 5\' 8"  (1.727 m)  Wt 180 lb (81.647 kg)  BMI 27.38 kg/m2  SpO2 99%  Physical Exam:  General: alert, cooperative and no distress Lochia: appropriate Uterine Fundus: firm Incision: n/a DVT Evaluation: No evidence of DVT seen on physical exam.  Hospital course:  Uncomplicated.  Rhophylac prior to discharge Contraception:  Vasectomy.  BCP's till effective Feeding:  Breast  Discharge Diagnoses: Term Pregnancy-delivered and anemia  Discharge Information: Date: 02/09/2013 Activity: pelvic rest Diet: routine Medications: see avs Condition: improved Instructions: refer to practice specific booklet Discharge to: home    Medication List    STOP taking these medications       ondansetron 4 MG disintegrating tablet  Commonly known as:  ZOFRAN-ODT     promethazine 50 MG tablet  Commonly known as:  PHENERGAN      TAKE these medications       ferrous sulfate 325 (65 FE) MG EC tablet  Take 1 tablet (325 mg total) by mouth 3 (three) times daily with meals.     ibuprofen 600 MG tablet  Commonly known as:  ADVIL,MOTRIN  Take 1 tablet (600 mg total) by mouth every 6 (six) hours as needed (pain scale < 4).     norethindrone 0.35 MG tablet  Commonly known as:  ORTHO MICRONOR  Take 1 tablet (0.35 mg total) by mouth daily.     oxyCODONE-acetaminophen 5-325 MG per tablet  Commonly known as:  PERCOCET/ROXICET  Take 1-2 tablets by mouth every 4 (four) hours as needed for severe pain.     pantoprazole 20 MG tablet  Commonly known as:  PROTONIX   Take 20 mg by mouth daily.     polyethylene glycol packet  Commonly known as:  MIRALAX / GLYCOLAX  Take 17 g by mouth daily as needed (For constipation.).       Follow-up Information   Follow up with [facility] In 6 weeks. (call for appt)       Newborn Data: Live born female  Birth Weight: 7 lb 8.8 oz (3425 g) APGAR: 9, 9  Home with mother.  Carylon Tamburro P 02/09/2013, 11:05 AM

## 2013-02-09 NOTE — Anesthesia Postprocedure Evaluation (Signed)
Anesthesia Post Note  Patient: Rachel Bartlett  Procedure(s) Performed: * No procedures listed *  Anesthesia type: Epidural  Patient location: Mother/Baby  Post pain: Pain level controlled  Post assessment: Post-op Vital signs reviewed  Last Vitals:  Filed Vitals:   02/09/13 0607  BP: 123/73  Pulse: 86  Temp: 36.5 C  Resp: 18    Post vital signs: Reviewed  Level of consciousness: awake  Complications: No apparent anesthesia complications

## 2013-02-10 LAB — RH IG WORKUP (INCLUDES ABO/RH)
Fetal Screen: NEGATIVE
Unit division: 0

## 2013-02-11 ENCOUNTER — Ambulatory Visit (HOSPITAL_COMMUNITY): Payer: Medicaid Other

## 2013-03-15 ENCOUNTER — Encounter (HOSPITAL_COMMUNITY): Payer: Self-pay | Admitting: General Practice

## 2013-03-15 ENCOUNTER — Inpatient Hospital Stay (HOSPITAL_COMMUNITY)
Admission: AD | Admit: 2013-03-15 | Discharge: 2013-03-15 | Disposition: A | Discharge status: Home / Self Care | Payer: Medicaid Other | Source: Ambulatory Visit | Attending: Obstetrics and Gynecology | Admitting: Obstetrics and Gynecology

## 2013-03-15 LAB — CBC
HCT: 36.6 % (ref 36.0–46.0)
Hemoglobin: 12.2 g/dL (ref 12.0–15.0)
MCH: 29 pg (ref 26.0–34.0)
MCHC: 33.3 g/dL (ref 30.0–36.0)
MCV: 86.9 fL (ref 78.0–100.0)
PLATELETS: 186 10*3/uL (ref 150–400)
RBC: 4.21 MIL/uL (ref 3.87–5.11)
RDW: 12.7 % (ref 11.5–15.5)
WBC: 4.5 10*3/uL (ref 4.0–10.5)

## 2013-03-15 LAB — POCT PREGNANCY, URINE: PREG TEST UR: NEGATIVE

## 2013-03-15 MED ORDER — NORGESTIMATE-ETH ESTRADIOL 0.25-35 MG-MCG PO TABS: 1.0000 | ORAL_TABLET | Freq: Every day | ORAL | Status: AC

## 2013-03-15 NOTE — MAU Note (Signed)
Bleeding through a pad and a tampon every hour.  Bleeding started 3 days ago. 1st period since delivery.  Feeling really tired and weak.  Called office, told to come here.

## 2013-03-15 NOTE — MAU Note (Signed)
Rachel Bartlett is a 23 y.o. female presenting for heavy vaginal bleeding for 48 hours. SVD 02/09/13 without complications. Reports normal lochia which stopped at 2 weeks. Started Progesterone-only pill at 2 weeks PP but d/c after 3 days and started Loestrin 1-20. Bleeding started 3 days ago and increased 2 days ago to 1 pad every 45 minutes with small clots and no pain.  History OB History   Grav Para Term Preterm Abortions TAB SAB Ect Mult Living   3 2 2  0 1 0 1 0 0 2     Past Medical History  Diagnosis Date  . PONV (postoperative nausea and vomiting)   . Seizures     psuedo seizures  . Anemia   . Hyperthyroidism   . Hypothyroidism   . Infection     UTI  . Fibromyalgia   . Anxiety   . Depression   . PID (acute pelvic inflammatory disease)   . Abnormal Pap smear     HPV   Past Surgical History  Procedure Laterality Date  . Dilation and curettage of uterus    . Rotator cuff repair      right  . Tonsillectomy        Blood pressure 119/51, pulse 73, temperature 98.7 F (37.1 C), temperature source Oral, resp. rate 18, last menstrual period 03/12/2013, not currently breastfeeding.  General Appearance: Alert, appropriate appearance for age. No acute distress HEENT Exam: Grossly normal Gastrointestinal Exam: soft, non-tender Psychiatric Exam: Alert and oriented, appropriate affect Pelvic:VVC normal. Moderate amount of blood in the vault. Uterus AV small and non-tender  CBC: hgb 12.2 with negative UPT +++++++++++++++++++++++++++++++++++++++++++++++++++++++++++++++  Assessment and plan:  DUB at 4 weeks post-partum probably 2nd to change of BCP in early post-partum Patient has a long standing history of DUB with hormonal contraception Ortho-Cyclen TID for 3 days, BID for 3 days, daily for 21 days followed by 7 days withdrawal. Office will contact patient for post-partum evaluation with ultrasound    Silverio LaySandra Djon Tith MD

## 2013-03-15 NOTE — Discharge Instructions (Signed)
Menorrhagia  Menorrhagia is the medical term for when your menstrual periods are heavy or last longer than usual. With menorrhagia, every period you have may cause enough blood loss and cramping that you are unable to maintain your usual activities.  CAUSES   In some cases, the cause of heavy periods is unknown, but a number of conditions may cause menorrhagia. Common causes include:  · A problem with the hormone-producing thyroid gland (hypothyroid).  · Noncancerous growths in the uterus (polyps or fibroids).  · An imbalance of the estrogen and progesterone hormones.  · One of your ovaries not releasing an egg during one or more months.  · Side effects of having an intrauterine device (IUD).  · Side effects of some medicines, such as anti-inflammatory medicines or blood thinners.  · A bleeding disorder that stops your blood from clotting normally.  SIGNS AND SYMPTOMS   During a normal period, bleeding lasts between 4 and 8 days. Signs that your periods are too heavy include:  · You routinely have to change your pad or tampon every 1 or 2 hours because it is completely soaked.  · You pass blood clots larger than 1 inch (2.5 cm) in size.  · You have bleeding for more than 7 days.  · You need to use pads and tampons at the same time because of heavy bleeding.  · You need to wake up to change your pads or tampons during the night.  · You have symptoms of anemia, such as tiredness, fatigue, or shortness of breath.   DIAGNOSIS   Your health care provider will perform a physical exam and ask you questions about your symptoms and menstrual history. Other tests may be ordered based on what the health care provider finds during the exam. These tests can include:  · Blood tests To check if you are pregnant or have hormonal changes, a bleeding or thyroid disorder, low iron levels (anemia), or other problems.  · Endometrial biopsy Your health care provider takes a sample of tissue from the inside of your uterus to be examined  under a microscope.  · Pelvic ultrasound This test uses sound waves to make a picture of your uterus, ovaries, and vagina. The pictures can show if you have fibroids or other growths.  · Hysteroscopy For this test, your health care provider will use a small telescope to look inside your uterus.  Based on the results of your initial tests, your health care provider may recommend further testing.  TREATMENT   Treatment may not be needed. If it is needed, your health care provider may recommend treatment with one or more medicines first. If these do not reduce bleeding enough, a surgical treatment might be an option. The best treatment for you will depend on:   · Whether you need to prevent pregnancy.    · Your desire to have children in the future.  · The cause and severity of your bleeding.  · Your opinion and personal preference.    Medicines for menorrhagia may include:  · Birth control methods that use hormones These include the pill, skin patch, vaginal ring, shots that you get every 3 months, hormonal IUD, and implant. These treatments reduce bleeding during your menstrual period.  · Medicines that thicken blood and slow bleeding.  · Medicines that reduce swelling, such as ibuprofen.   · Medicines that contain a synthetic hormone called progestin.    · Medicines that make the ovaries stop working for a short time.    You may need surgical   treatment for menorrhagia if the medicines are unsuccessful. Treatment options include:  · Dilation and curettage (D&C) In this procedure, your health care provider opens (dilates) your cervix and then scrapes or suctions tissue from the lining of your uterus to reduce menstrual bleeding.  · Operative hysteroscopy This procedure uses a tiny tube with a light (hysteroscope) to view your uterine cavity and can help in the surgical removal of a polyp that may be causing heavy periods.  · Endometrial ablation Through various techniques, your health care provider permanently  destroys the entire lining of your uterus (endometrium). After endometrial ablation, most women have little or no menstrual flow. Endometrial ablation reduces your ability to become pregnant.  · Endometrial resection This surgical procedure uses an electrosurgical wire loop to remove the lining of the uterus. This procedure also reduces your ability to become pregnant.  · Hysterectomy Surgical removal of the uterus and cervix is a permanent procedure that stops menstrual periods. Pregnancy is not possible after a hysterectomy. This procedure requires anesthesia and hospitalization.  HOME CARE INSTRUCTIONS   · Only take over-the-counter or prescription medicines as directed by your health care provider. Take prescribed medicines exactly as directed. Do not change or switch medicines without consulting your health care provider.  · Take any prescribed iron pills exactly as directed by your health care provider. Long-term heavy bleeding may result in low iron levels. Iron pills help replace the iron your body lost from heavy bleeding. Iron may cause constipation. If this becomes a problem, increase the bran, fruits, and roughage in your diet.  · Do not take aspirin or medicines that contain aspirin 1 week before or during your menstrual period. Aspirin may make the bleeding worse.  · If you need to change your sanitary pad or tampon more than once every 2 hours, stay in bed and rest as much as possible until the bleeding stops.  · Eat well-balanced meals. Eat foods high in iron. Examples are leafy green vegetables, meat, liver, eggs, and whole grain breads and cereals. Do not try to lose weight until the abnormal bleeding has stopped and your blood iron level is back to normal.  SEEK MEDICAL CARE IF:   · You soak through a pad or tampon every 1 or 2 hours, and this happens every time you have a period.  · You need to use pads and tampons at the same time because you are bleeding so much.  · You need to change your pad  or tampon during the night.  · You have a period that lasts for more than 8 days.  · You pass clots bigger than 1 inch wide.  · You have irregular periods that happen more or less often than once a month.  · You feel dizzy or faint.  · You feel very weak or tired.  · You feel short of breath or feel your heart is beating too fast when you exercise.  · You have nausea and vomiting or diarrhea while you are taking your medicine.  · You have any problems that may be related to the medicine you are taking.  SEEK IMMEDIATE MEDICAL CARE IF:   · You soak through 4 or more pads or tampons in 2 hours.  · You have any bleeding while you are pregnant.  MAKE SURE YOU:   · Understand these instructions.  · Will watch your condition.  · Will get help right away if you are not doing well or get worse.    Document Released: 02/03/2005 Document Revised: 11/24/2012 Document Reviewed: 07/25/2012  [employer]® Patient Information ©2014 [employer].

## 2013-04-23 ENCOUNTER — Encounter: Payer: Self-pay | Admitting: Emergency Medicine

## 2013-04-23 ENCOUNTER — Emergency Department (INDEPENDENT_AMBULATORY_CARE_PROVIDER_SITE_OTHER)
Admission: EM | Admit: 2013-04-23 | Discharge: 2013-04-23 | Disposition: A | Discharge status: Home / Self Care | Payer: Medicaid Other | Source: Home / Self Care | Attending: Family Medicine | Admitting: Family Medicine

## 2013-04-23 DIAGNOSIS — J069 Acute upper respiratory infection, unspecified: Secondary | ICD-10-CM

## 2013-04-23 LAB — POCT RAPID STREP A (OFFICE)
RAPID STREP A SCREEN: NEGATIVE
Rapid Strep A Screen: NEGATIVE

## 2013-04-23 NOTE — ED Notes (Addendum)
Reports 3-4 days of congestion, ear pain, hoarseness, cough; denies fever; sore throat. No OTCs past 4 hours. Two months post partum.

## 2013-04-23 NOTE — ED Provider Notes (Signed)
CSN: [id number]     Arrival date & time 04/23/13  1516 History   First MD Initiated Contact with Patient 04/23/13 1547     Chief Complaint  Patient presents with  . Hoarse  . Otalgia  . Nasal Congestion  . Cough      HPI Comments: Patient complains of onset of a sore throat yesterday, followed by a cough, sinus congestion, and earache bilaterally.  Today she awoke hoarse.  The history is provided by the patient.    Past Medical History  Diagnosis Date  . PONV (postoperative nausea and vomiting)   . Seizures     psuedo seizures  . Anemia   . Hyperthyroidism   . Hypothyroidism   . Infection     UTI  . Fibromyalgia   . Anxiety   . Depression   . PID (acute pelvic inflammatory disease)   . Abnormal Pap smear     HPV   Past Surgical History  Procedure Laterality Date  . Dilation and curettage of uterus    . Rotator cuff repair      right  . Tonsillectomy     Family History  Problem Relation Age of Onset  . Diabetes Maternal Grandmother   . Heart disease Maternal Grandmother   . Hyperlipidemia Maternal Grandmother   . Stroke Maternal Grandmother   . Hypertension Maternal Grandmother   . Cancer Maternal Grandmother     throat  . Cancer Maternal Grandfather     stomach  . Hyperlipidemia Maternal Grandfather   . Stroke Maternal Grandfather   . Dementia Maternal Grandfather    History  Substance Use Topics  . Smoking status: Never Smoker   . Smokeless tobacco: Never Used  . Alcohol Use: No   OB History   Grav Para Term Preterm Abortions TAB SAB Ect Mult Living   3 2 2  0 1 0 1 0 0 2     Review of Systems + sore throat + cough + hoarseness No pleuritic pain No wheezing + nasal congestion + post-nasal drainage No sinus pain/pressure No itchy/red eyes ? earache No hemoptysis No SOB No fever, + chills No nausea No vomiting No abdominal pain No diarrhea No urinary symptoms No skin rash + fatigue No myalgias No headache Used OTC meds without  relief  Allergies  Shellfish allergy; Celexa; and Codeine  Home Medications   Current Outpatient Rx  Name  Route  Sig  Dispense  Refill  . norgestimate-ethinyl estradiol (ORTHO-CYCLEN, 28,) 0.25-35 MG-MCG tablet   Oral   Take 1 tablet by mouth daily. Take 1 tablet by mouth 3 times daily for 3 days then Take 1 tablet by mouth 2 times daily for 3 days then Take 1 tablet by mouth daily for 21 days Do not use placebo pills during these 27 days Stop taking the pill for a full 7 days Resume a new pack of pills on 04/19/13   3 Package   4    BP 103/67  Pulse 90  Temp(Src) 98.2 F (36.8 C) (Oral)  Resp 16  Ht 5\' 8"  (1.727 m)  Wt 160 lb (72.576 kg)  BMI 24.33 kg/m2  SpO2 100% Physical Exam Nursing notes and Vital Signs reviewed. Appearance:  Patient appears healthy, stated age, and in no acute distress Eyes:  Pupils are equal, round, and reactive to light and accomodation.  Extraocular movement is intact.  Conjunctivae are not inflamed  Ears:  Canals normal.  Tympanic membranes normal.  Nose:  Mildly congested  turbinates.  No sinus tenderness.    Pharynx:   Minimal erythema Neck:  Supple.   Tender shotty anterior nodes; large tender right posterior nodes Lungs:  Clear to auscultation.  Breath sounds are equal.  Heart:  Regular rate and rhythm without murmurs, rubs, or gallops.  Abdomen:  Nontender without masses or hepatosplenomegaly.  Bowel sounds are present.  No CVA or flank tenderness.  Extremities:  No edema.  No calf tenderness Skin:  No rash present.   ED Course  Procedures  none    Labs Reviewed  STREP A DNA PROBE  POCT RAPID STREP A (OFFICE) negative           MDM   1. Acute upper respiratory infections of unspecified site; suspect early viral URI    Throat culture pending.  Treat symptomatically for now: Prescription written for Benzonatate [facility](Tessalon) to take at bedtime for night-time cough.  Take plain Mucinex (1200 mg guaifenesin) twice daily for cough and  congestion.  May add Sudafed for sinus congestion.   Increase fluid intake, rest. May use Afrin nasal spray (or generic oxymetazoline) twice daily for about 5 days.  Also recommend using saline nasal spray several times daily and saline nasal irrigation (AYR is a common brand) Try warm salt water gargles for sore throat.  Stop all antihistamines for now, and other non-prescription cough/cold preparations. May take Ibuprofen 200mg , 4 tabs every 8 hours with food for sore throat, fever, etc. Begin Azithromycin if not improving about 5 days or if persistent fever develops (Given a prescription to hold, with an expiration date)  Follow-up with family doctor if not improving 7 to 10 days.     Lattie HawStephen A Oda Placke, MD 04/25/13 [phone]

## 2013-04-23 NOTE — Discharge Instructions (Signed)
Take plain Mucinex (1200 mg guaifenesin) twice daily for cough and congestion.  May add Sudafed for sinus congestion.   Increase fluid intake, rest. May use Afrin nasal spray (or generic oxymetazoline) twice daily for about 5 days.  Also recommend using saline nasal spray several times daily and saline nasal irrigation (AYR is a common brand) Try warm salt water gargles for sore throat.  Stop all antihistamines for now, and other non-prescription cough/cold preparations. May take Ibuprofen 200mg , 4 tabs every 8 hours with food for sore throat, fever, etc. Begin Azithromycin if not improving about 5 days or if persistent fever develops  Follow-up with family doctor if not improving 7 to 10 days.

## 2013-04-24 ENCOUNTER — Telehealth: Payer: Self-pay | Admitting: Emergency Medicine

## 2013-04-24 NOTE — ED Notes (Signed)
Left message to call us; need to know if she is breastfeeding prior to prescription decision from Dr. Cathren HarshBeese. Patient left before final discharge instructions yesterday.

## 2013-04-25 LAB — STREP A DNA PROBE: GASP: NEGATIVE

## 2013-04-26 ENCOUNTER — Telehealth: Payer: Self-pay | Admitting: *Deleted

## 2013-04-27 ENCOUNTER — Telehealth: Payer: Self-pay | Admitting: *Deleted

## 2013-12-19 ENCOUNTER — Encounter: Payer: Self-pay | Admitting: Emergency Medicine

## 2014-02-17 IMAGING — US US RENAL
1 series · 14 of 25 positions shown · non-contrast
Comparison: None.

CLINICAL DATA: Chronic back pain. History of "blocked kidneys".
Prior D and C. Currently 30 weeks 5 days pregnant.

EXAM:
RENAL/URINARY TRACT ULTRASOUND COMPLETE

[Series 1: us renal · 0.28mm/px · 14 of 39 slices shown]
[im 1/39]
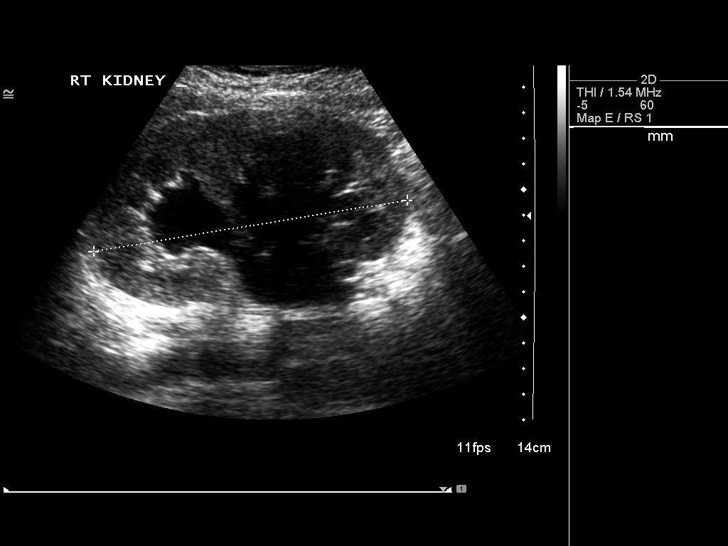
[im 4/39]
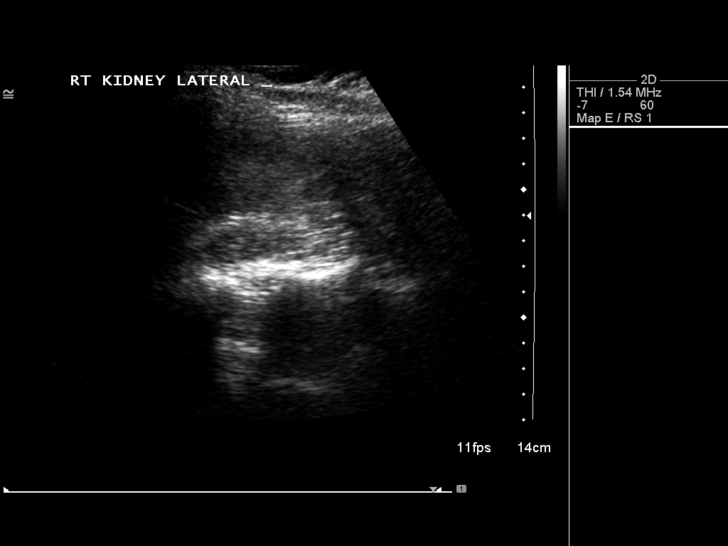
[im 7/39]
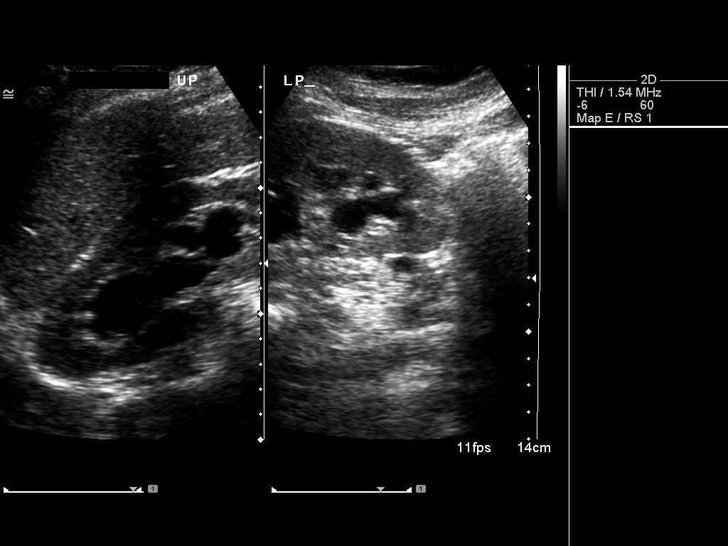
[im 10/39]
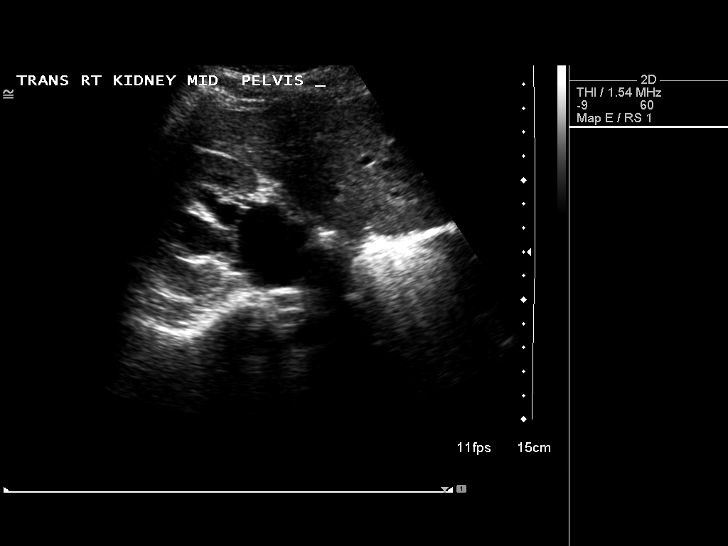
[im 13/39]
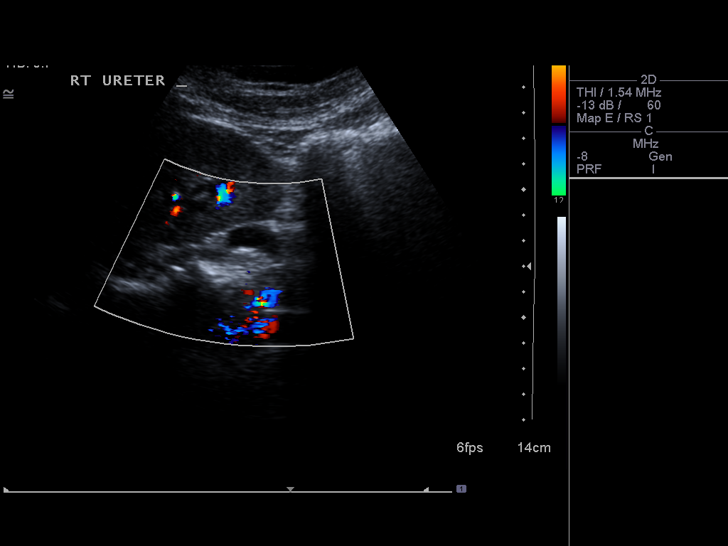
[im 15/39]
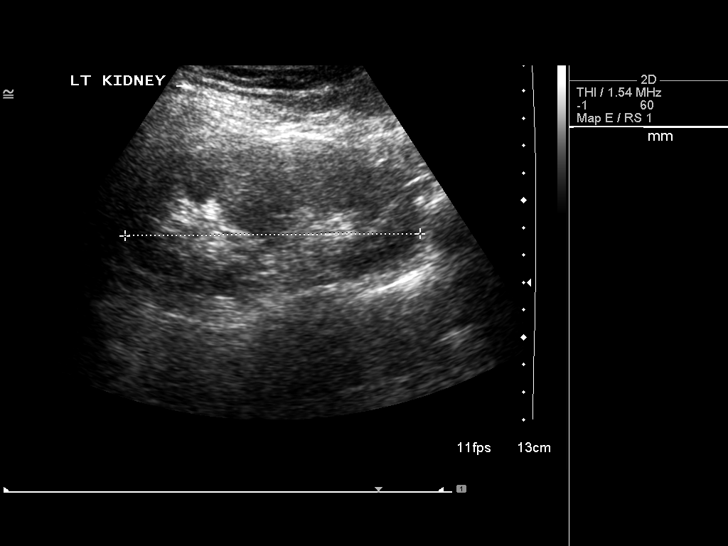
[im 18/39]
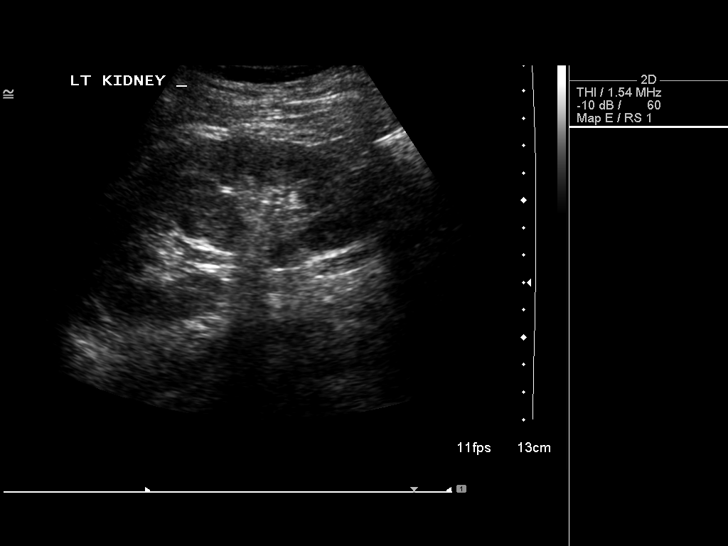
[im 21/39]
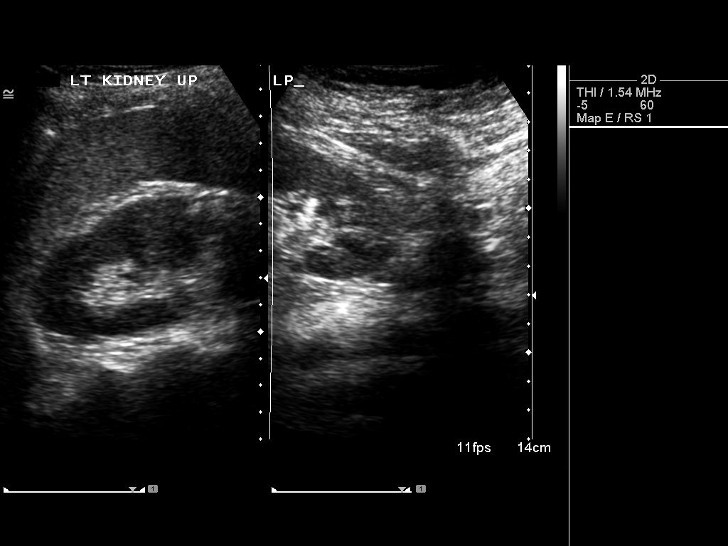
[im 24/39]
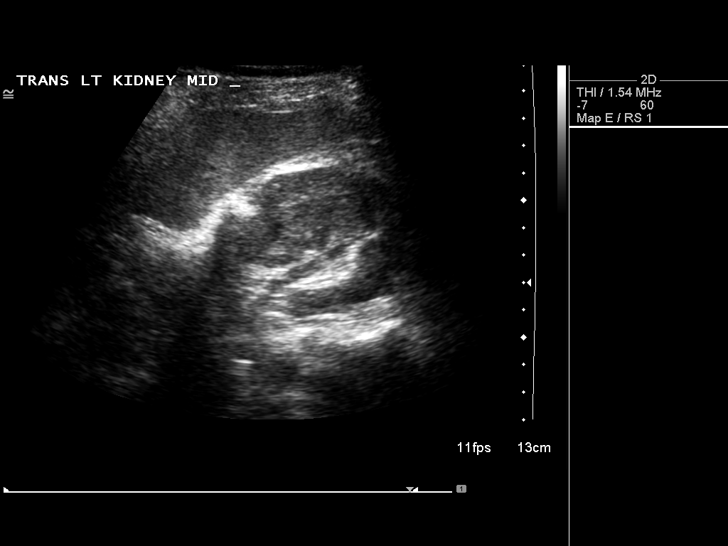
[im 26/39]
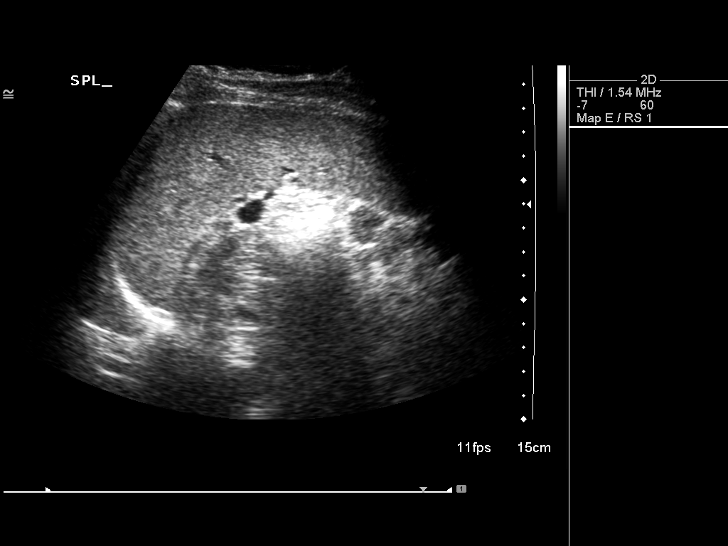
[im 29/39]
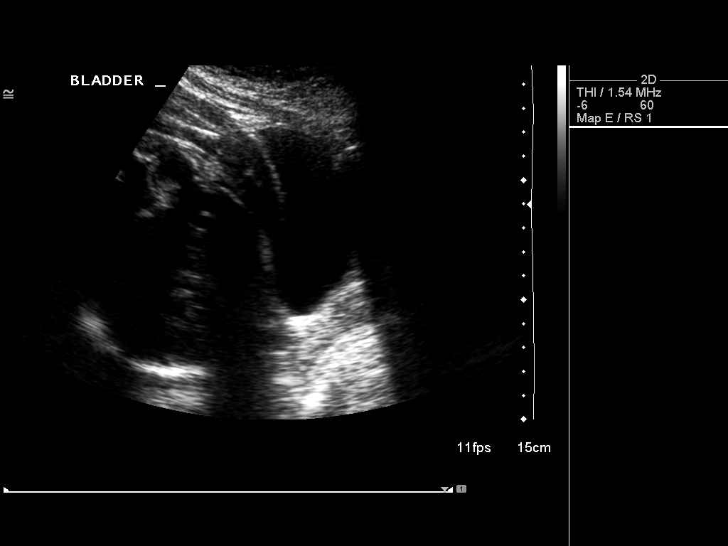
[im 32/39]
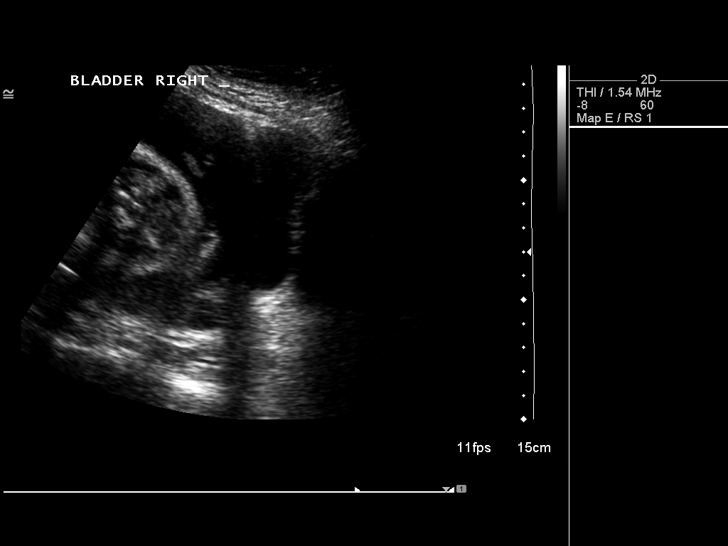
[im 35/39]
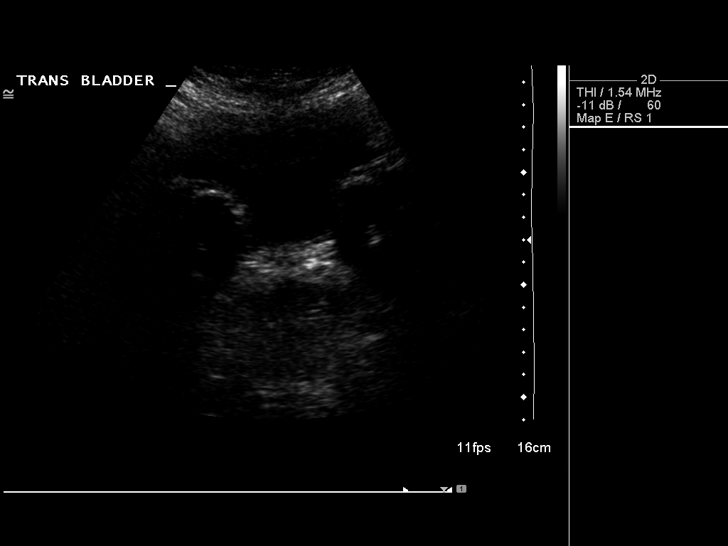
[im 39/39]
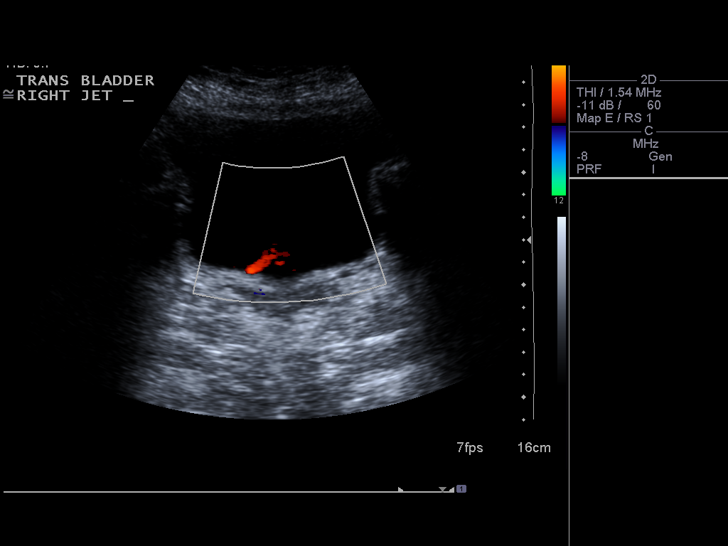

[14 of 25 positions shown; findings below may reference images not displayed]

FINDINGS: Right Kidney

Length: 12.4 cm. There is moderate dilatation of the right renal
pelvis and calices. The proximal right ureter appears mildly
dilated. No calculi are visualized. No cortical abnormalities are
identified.

Left Kidney

Length: 10.8 cm. Echogenicity within normal limits. No mass or
hydronephrosis visualized.

Bladder

Appears normal for the degree of distention. Bilateral ureteral jets
are demonstrated. The distal ureters are not visualized.
IMPRESSION: Nonspecific right-sided hydronephrosis with preserved bilateral
ureteral jets. Findings could be secondary to congenital UPJ
stenosis exacerbated by pregnancy. No urinary tract calculi
visualized.

## 2014-03-31 ENCOUNTER — Encounter (HOSPITAL_COMMUNITY): Payer: Self-pay | Admitting: *Deleted

## 2014-03-31 ENCOUNTER — Inpatient Hospital Stay (HOSPITAL_COMMUNITY)
Admission: AD | Admit: 2014-03-31 | Discharge: 2014-03-31 | Disposition: A | Discharge status: Home / Self Care | Payer: Medicaid Other | Source: Ambulatory Visit | Attending: Obstetrics & Gynecology | Admitting: Obstetrics & Gynecology

## 2014-03-31 ENCOUNTER — Inpatient Hospital Stay (HOSPITAL_COMMUNITY): Payer: Medicaid Other

## 2014-03-31 DIAGNOSIS — O9989 Other specified diseases and conditions complicating pregnancy, childbirth and the puerperium: Secondary | ICD-10-CM | POA: Insufficient documentation

## 2014-03-31 DIAGNOSIS — M797 Fibromyalgia: Secondary | ICD-10-CM | POA: Insufficient documentation

## 2014-03-31 DIAGNOSIS — Z3A23 23 weeks gestation of pregnancy: Secondary | ICD-10-CM | POA: Diagnosis not present

## 2014-03-31 DIAGNOSIS — N1330 Unspecified hydronephrosis: Secondary | ICD-10-CM | POA: Diagnosis not present

## 2014-03-31 DIAGNOSIS — M549 Dorsalgia, unspecified: Secondary | ICD-10-CM

## 2014-03-31 DIAGNOSIS — G8929 Other chronic pain: Secondary | ICD-10-CM | POA: Diagnosis present

## 2014-03-31 LAB — CBC WITH DIFFERENTIAL/PLATELET
Basophils Absolute: 0 10*3/uL (ref 0.0–0.1)
Basophils Relative: 0 % (ref 0–1)
EOS ABS: 0 10*3/uL (ref 0.0–0.7)
Eosinophils Relative: 0 % (ref 0–5)
HCT: 32.9 % — ABNORMAL LOW (ref 36.0–46.0)
HEMOGLOBIN: 11.6 g/dL — AB (ref 12.0–15.0)
LYMPHS ABS: 0.4 10*3/uL — AB (ref 0.7–4.0)
Lymphocytes Relative: 6 % — ABNORMAL LOW (ref 12–46)
MCH: 32.5 pg (ref 26.0–34.0)
MCHC: 35.3 g/dL (ref 30.0–36.0)
MCV: 92.2 fL (ref 78.0–100.0)
MONO ABS: 0.2 10*3/uL (ref 0.1–1.0)
MONOS PCT: 4 % (ref 3–12)
Neutro Abs: 6 10*3/uL (ref 1.7–7.7)
Neutrophils Relative %: 90 % — ABNORMAL HIGH (ref 43–77)
Platelets: 152 10*3/uL (ref 150–400)
RBC: 3.57 MIL/uL — ABNORMAL LOW (ref 3.87–5.11)
RDW: 12.9 % (ref 11.5–15.5)
WBC: 6.6 10*3/uL (ref 4.0–10.5)

## 2014-03-31 LAB — URINALYSIS, ROUTINE W REFLEX MICROSCOPIC
GLUCOSE, UA: NEGATIVE mg/dL
Hgb urine dipstick: NEGATIVE
Ketones, ur: 40 mg/dL — AB
Leukocytes, UA: NEGATIVE
Nitrite: NEGATIVE
PH: 6 (ref 5.0–8.0)
Protein, ur: NEGATIVE mg/dL
Urobilinogen, UA: 0.2 mg/dL (ref 0.0–1.0)

## 2014-03-31 LAB — COMPREHENSIVE METABOLIC PANEL
ALBUMIN: 3.1 g/dL — AB (ref 3.5–5.2)
ALK PHOS: 50 U/L (ref 39–117)
ALT: 10 U/L (ref 0–35)
ANION GAP: 6 (ref 5–15)
AST: 19 U/L (ref 0–37)
BUN: 11 mg/dL (ref 6–23)
CALCIUM: 7.7 mg/dL — AB (ref 8.4–10.5)
CO2: 23 mmol/L (ref 19–32)
CREATININE: 0.61 mg/dL (ref 0.50–1.10)
Chloride: 102 mmol/L (ref 96–112)
GFR calc non Af Amer: >90 mL/min (ref >90)
GLUCOSE: 105 mg/dL — AB (ref 70–99)
Potassium: 3.1 mmol/L — ABNORMAL LOW (ref 3.5–5.1)
Sodium: 131 mmol/L — ABNORMAL LOW (ref 135–145)
Total Bilirubin: 0.9 mg/dL (ref 0.3–1.2)
Total Protein: 6.3 g/dL (ref 6.0–8.3)

## 2014-03-31 MED ORDER — LACTATED RINGERS IV BOLUS (SEPSIS)
500.0000 mL | Freq: Once | INTRAVENOUS | Status: AC
  Administered 2014-03-31: 1000 mL via INTRAVENOUS

## 2014-03-31 MED ORDER — MORPHINE SULFATE 4 MG/ML IJ SOLN
4.0000 mg | Freq: Once | INTRAMUSCULAR | Status: AC
  Administered 2014-03-31: 4 mg via INTRAMUSCULAR
  Filled 2014-03-31: qty 1

## 2014-03-31 MED ORDER — LACTATED RINGERS IV SOLN: INTRAVENOUS | Status: DC

## 2014-03-31 MED ORDER — OXYCODONE-ACETAMINOPHEN 2.5-325 MG PO TABS: 1.0000 | ORAL_TABLET | Freq: Three times a day (TID) | ORAL | Status: AC | PRN

## 2014-03-31 NOTE — Discharge Instructions (Signed)
Hydronephrosis  Hydronephrosis is an abnormal enlargement of your kidney. It can affect one or both the kidneys. It results from the backward pressure of urine on the kidneys, when the flow of urine is blocked. Normally, the urine drains from the kidney through the urine tube (ureter), into a sac which holds the urine until urination (bladder). When the urinary flow is blocked, the urine collects above the block. This causes an increase in the pressure inside the kidney, which in turn leads to its enlargement. The block can occur at the point where the kidney joins the ureter. Treatment depends on the cause and location of the block.   CAUSES   The causes of this condition include:  · Birth defect of the kidney or ureter.  · Kink at the point where the kidney joins the ureter.  · Stones and blood clots in the kidney or ureter.  · Cancer, injury, or infection of the ureter.  · Scar tissue formation.  · Backflow of urine (reflux).  · Cancer of bladder or prostate gland.  · Abnormality of the nerves or muscles of the kidney or ureter.  · Lower part of the ureter protruding into the bladder (ureterocele).  · Abnormal contractions of the bladder.  · Both the kidneys can be affected during pregnancy. This is because the enlarging uterus presses on the ureters and blocks the flow of urine.  SYMPTOMS   The symptoms depend on the location of the block. They also depend on how long the block has been present. You may feel pain on the affected side. Sometimes, you may not have any symptoms. There may be a dull ache or discomfort in the flank. The common symptoms are:  · Flank pain.  · Swelling of the abdomen.  · Pain in the abdomen.  · Nausea and vomiting.  · Fever.  · Pain while passing urine.  · Urgency for urination.  · Frequent or urgent urination.  · Infection of the urinary tract.  DIAGNOSIS   Your caregiver will examine you after asking about your symptoms. You may be asked to do blood and urine tests. Your caregiver  may order a special X-ray, ultrasound, or CT scan. Sometimes a rigid or flexible telescope (cystoscope) is used to view the site of the blockage.   TREATMENT   Treatment depends on the site, cause, and duration of the block. The goal of treatment is to remove the blockage. Your caregiver will plan the treatment based on your condition. The different types of treatment are:   · Putting in a soft plastic tube (ureteral stent) to connect the bladder with the kidney. This will help in draining the urine.  · Putting in a soft tube (nephrostomy tube). This is placed through skin into the kidney. The trapped urine is drained out through the back. A plastic bag is attached to your skin to hold the urine that has drained out.  · Antibiotics to treat or prevent infection.  · Breaking down of the stone (lithotripsy).  HOME CARE INSTRUCTIONS   · It may take some time for the hydronephrosis to go away (resolve). Drink fluids as directed by your caregiver , and get a lot of rest.  · If you have a drain in, your caregiver will give you directions about how to care for it. Be sure you understand these directions completely before you go home.  · Take any antibiotics, pain medications, or other prescriptions exactly as prescribed.  · Follow-up with your caregivers   as directed.  SEEK MEDICAL CARE IF:   · You continue to have flank pain, nausea, or difficulty with urination.  · You have any problem with any type of drainage device.  · Your urine becomes cloudy or bloody.  SEEK IMMEDIATE MEDICAL CARE IF:   · You have severe flank and/or abdominal pain.  · You develop vomiting and are unable to hold down fluids.  · You develop a fever above 100.5° F (38.1° C), or as per your caregiver.  MAKE SURE YOU:   · Understand these instructions.  · Will watch your condition.  · Will get help right away if you are not doing well or get worse.  Document Released: 12/01/2006 Document Revised: 04/28/2011 Document Reviewed: 01/17/2010  [employer]®  Patient Information ©2015 [employer]. This information is not intended to replace advice given to you by your health care provider. Make sure you discuss any questions you have with your health care provider.

## 2014-03-31 NOTE — MAU Provider Note (Signed)
History    Rachel Bartlett is a 23y.o. [id number] at 23.3wks who presents, after phone call, for back pain, bodyaches, chills, lower abdominal pain, n/v, and diarrhea.  Patient describes back pain as aching, stabbing, shooting, and presents on both sides, but "main side is on my left."  Patient also reports abdominal pain is "everywhere" but can generalize to lower quadrant area.  Patient describes this pain as "constant pain, cramping, and stuff."  Patient states she had buring with urination upon arrival, but none prior to.  Patient admits to history of fibromyalgia, diagnosed 5 years ago, and is currently being managed by Dr. Lorenso Courier, but has not seen her for over a year.  Patient reports active fetus, denies VB, contractions, or vaginal discharge.  Patient reports taking Zofran at 6pm. Patient reports recent illness in the household, daughter with parovirus and son with "some type of virus."   Patient Active Problem List   Diagnosis Date Noted  . Anemia 02/09/2013  . IBS (irritable bowel syndrome) 02/08/2013  . Chronic back pain 02/08/2013  . Blood type, Rh negative 02/08/2013  . Active labor at term 02/08/2013  . Normal vaginal delivery 02/08/2013    No chief complaint on file.  HPI  OB History    Gravida Para Term Preterm AB TAB SAB Ectopic Multiple Living   0 1 0 1 0 0 2      Past Medical History  Diagnosis Date  . PONV (postoperative nausea and vomiting)   . Seizures     psuedo seizures  . Anemia   . Hyperthyroidism   . Hypothyroidism   . Infection     UTI  . Fibromyalgia   . Anxiety   . Depression   . PID (acute pelvic inflammatory disease)   . Abnormal Pap smear     HPV    Past Surgical History  Procedure Laterality Date  . Dilation and curettage of uterus    . Rotator cuff repair      right  . Tonsillectomy      Family History  Problem Relation Age of Onset  . Diabetes Maternal Grandmother   . Heart disease Maternal Grandmother   . Hyperlipidemia  Maternal Grandmother   . Stroke Maternal Grandmother   . Hypertension Maternal Grandmother   . Cancer Maternal Grandmother     throat  . Cancer Maternal Grandfather     stomach  . Hyperlipidemia Maternal Grandfather   . Stroke Maternal Grandfather   . Dementia Maternal Grandfather     History  Substance Use Topics  . Smoking status: Never Smoker   . Smokeless tobacco: Never Used  . Alcohol Use: No    Allergies:  Allergies  Allergen Reactions  . Shellfish Allergy Anaphylaxis, Shortness Of Breath, Nausea And Vomiting and Swelling  . Celexa [Citalopram Hydrobromide] Hives, Swelling and Other (See Comments)    Causes hairloss  . Codeine Hives, Swelling and Other (See Comments)    Causes hairloss    Prescriptions prior to admission  Medication Sig Dispense Refill Last Dose  . azithromycin (ZITHROMAX) 1 G powder Take 1 g by mouth once.   03/30/2014 at Unknown time  . ondansetron (ZOFRAN) 8 MG tablet Take by mouth every 8 (eight) hours as needed for nausea or vomiting.   03/30/2014 at Unknown time  . norgestimate-ethinyl estradiol (ORTHO-CYCLEN, 28,) 0.25-35 MG-MCG tablet Take 1 tablet by mouth daily. Take 1 tablet by mouth 3 times daily for 3 days then Take 1 tablet by  mouth 2 times daily for 3 days then Take 1 tablet by mouth daily for 21 days Do not use placebo pills during these 27 days Stop taking the pill for a full 7 days Resume a new pack of pills on 04/19/13 3 Package 4     ROS  See HPI Above Physical Exam   Blood pressure 112/69, pulse 113, temperature 98.3 F (36.8 C), temperature source Oral, resp. rate 18, height  (1.727 m), weight 167 lb (75.751 kg), SpO2 100 %, not currently breastfeeding.  Results for orders placed or performed during the hospital encounter of 03/31/14 (from the past 24 hour(s))  Urinalysis, Routine w reflex microscopic     Status: Abnormal   Collection Time: 03/31/14 12:40 AM  Result Value Ref Range   Color, Urine YELLOW YELLOW    APPearance CLEAR CLEAR   Specific Gravity, Urine >1.030 (H) 1.005 - 1.030   pH 6.0 5.0 - 8.0   Glucose, UA NEGATIVE NEGATIVE mg/dL   Hgb urine dipstick NEGATIVE NEGATIVE   Bilirubin Urine SMALL (A) NEGATIVE   Ketones, ur 40 (A) NEGATIVE mg/dL   Protein, ur NEGATIVE NEGATIVE mg/dL   Urobilinogen, UA 0.2 0.0 - 1.0 mg/dL   Nitrite NEGATIVE NEGATIVE   Leukocytes, UA NEGATIVE NEGATIVE  CBC with Differential/Platelet     Status: Abnormal   Collection Time: 03/31/14  1:48 AM  Result Value Ref Range   WBC 6.6 4.0 - 10.5 K/uL   RBC 3.57 (L) 3.87 - 5.11 MIL/uL   Hemoglobin 11.6 (L) 12.0 - 15.0 g/dL   HCT 91.4 (L) 78.2 - 95.6 %   MCV 92.2 78.0 - 100.0 fL   MCH 32.5 26.0 - 34.0 pg   MCHC 35.3 30.0 - 36.0 g/dL   RDW 21.3 08.6 - 57.8 %   Platelets 152 150 - 400 K/uL   Neutrophils Relative % 90 (H) 43 - 77 %   Neutro Abs 6.0 1.7 - 7.7 K/uL   Lymphocytes Relative 6 (L) 12 - 46 %   Lymphs Abs 0.4 (L) 0.7 - 4.0 K/uL   Monocytes Relative 4 3 - 12 %   Monocytes Absolute 0.2 0.1 - 1.0 K/uL   Eosinophils Relative 0 0 - 5 %   Eosinophils Absolute 0.0 0.0 - 0.7 K/uL   Basophils Relative 0 0 - 1 %   Basophils Absolute 0.0 0.0 - 0.1 K/uL   Physical Exam  Constitutional: She is oriented to person, place, and time. She appears well-developed and well-nourished.  HENT:  Head: Normocephalic and atraumatic.  Eyes: EOM are normal.  Neck: Normal range of motion.  Cardiovascular: Normal rate, regular rhythm and normal heart sounds.   Respiratory: Effort normal and breath sounds normal.  GI: Soft. Bowel sounds are normal. There is CVA tenderness.  Genitourinary:  Deferred  Musculoskeletal: Normal range of motion.       Lumbar back: She exhibits no tenderness (.Bilaterally).  Neurological: She is alert and oriented to person, place, and time.  Skin: Skin is warm and dry.  Psychiatric: She has a normal mood and affect. Her behavior is normal.   FHR: 120 bpm, Mod Var, -Decels, +Accels UC: None graphed  or palpated  CLINICAL DATA: Chronic back pain. History of multiple urinary tract infection. [redacted] weeks pregnant. Normal white cell count.  EXAM: RENAL/URINARY TRACT ULTRASOUND COMPLETE  COMPARISON from 11/2012: FINDINGS:Right Kidney Length: 12.4 cm. There is moderate dilatation of the right renal pelvis and calices. The proximal right ureter appears mildly dilated. No  calculi are visualized. No cortical abnormalities are identified. Left Kidney Length: 10.8 cm. Echogenicity within normal limits. No mass or hydronephrosis visualized. Bladder Appears normal for the degree of distention. Bilateral ureteral jets are demonstrated. The distal ureters are not visualized. IMPRESSION: Nonspecific right-sided hydronephrosis with preserved bilateral ureteral jets. 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 No urinary tract calculi visualized.  FINDINGS: Right Kidney: Length: 12.2 mm. Moderate right renal hydronephrosis with pyelocaliectasis and proximal ureterectasis. Mid/distal ureter are not visualized. Pelvis could be related to compression by the pregnancy, the appearance is similar to previous study may represent chronic hydronephrosis or reflux disease.  Left Kidney: Length: 11 cm. Echogenicity within normal limits. No mass or hydronephrosis visualized.  Bladder: Bladder is decompressed and not well evaluated.  IMPRESSION: Moderate right renal hydronephrosis, similar prior study. Left kidney is normal. ED Course  Assessment: IUP at 23.3wks Hydronephrosis  Plan: -PE as above -Labs: UA, CBC with Dif -Start IV-LR bolus/infusion -IV Morphine -Renal US  Follow Up ([id number]) -US returns as above, no significant change from 2014 -Dr. Carmela HurtE. Kulwa consulted and advised as below: -Add CMP to order -Rx for percocet -F/U in office in one week -Urology consult -POC discussed with patient who verbalized understanding -Bleeding and Labor Precautions -Encouraged  to call if any questions or concerns arise prior to next scheduled office visit.  -Discharged to home in good condition  Acel Natzke LYNN CNM, MSN 03/31/2014 1:22 AM

## 2014-03-31 NOTE — MAU Note (Addendum)
PT  SAYS SHE STARTED  HAVING  BACK ACHE,   ABD  PAIN,  VOMITING-  X 5  ,  DIARRHEA  X1 .   HAD  CHILLS.     THEN WENT   TO DR-   VE-  CLOSED,   TAP ON BACK- SAID MAYBE POLYP.   TOOK TYLENOLL AT  6PM    4  REG   STRENGTH    GAVE RX  FOR  Z- PACK  AND  ZOFRAN-   TOOK YESTERDAY.   TOOK ZOFRAN  AT [facility]6PM

## 2014-03-31 NOTE — MAU Note (Signed)
Pt reports she was seen at [facility] this am because of lower back pain, chills, vomiting. States she had diarrhea x one.

## 2014-04-02 LAB — CULTURE, OB URINE
CULTURE: NO GROWTH
Colony Count: NO GROWTH

## 2015-02-03 ENCOUNTER — Encounter (HOSPITAL_COMMUNITY): Payer: Self-pay | Admitting: *Deleted

## 2015-06-12 IMAGING — US US RENAL
1 series · 14 of 25 positions shown · non-contrast
Comparison: 12/06/2012

CLINICAL DATA: Chronic back pain. History of multiple urinary tract
infection. 23 weeks pregnant. Normal white cell count.

EXAM:
RENAL/URINARY TRACT ULTRASOUND COMPLETE

[Series 1: us renal · 14 of 32 slices shown]
[im 1/32]
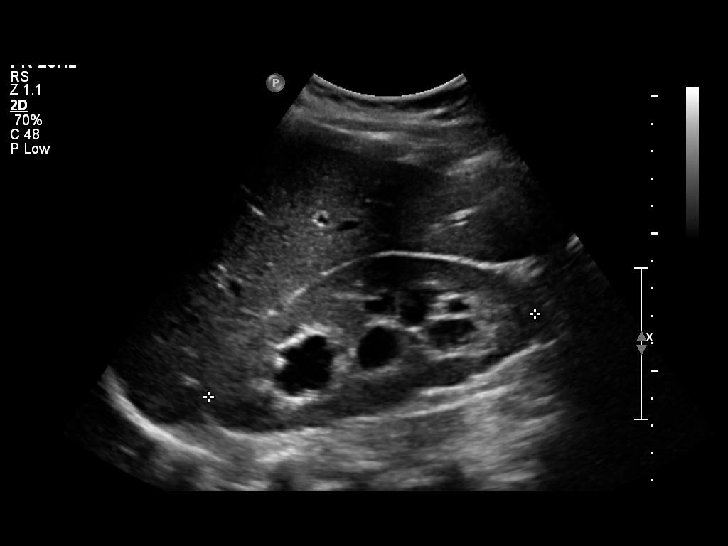
[im 3/32]
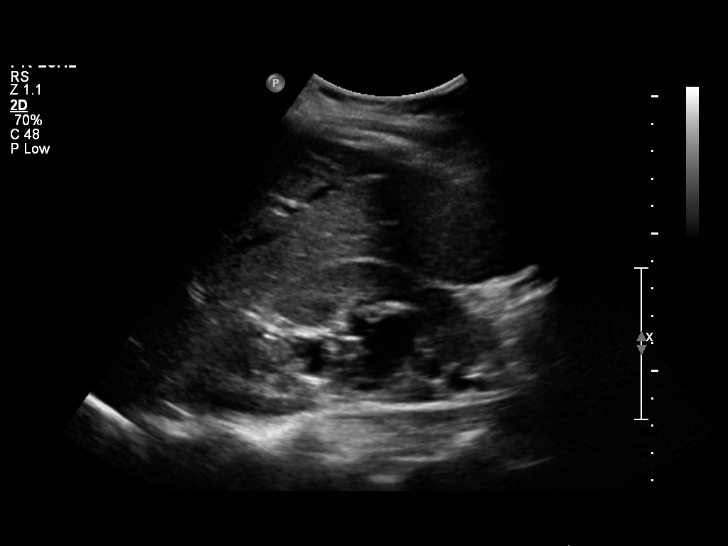
[im 6/32]
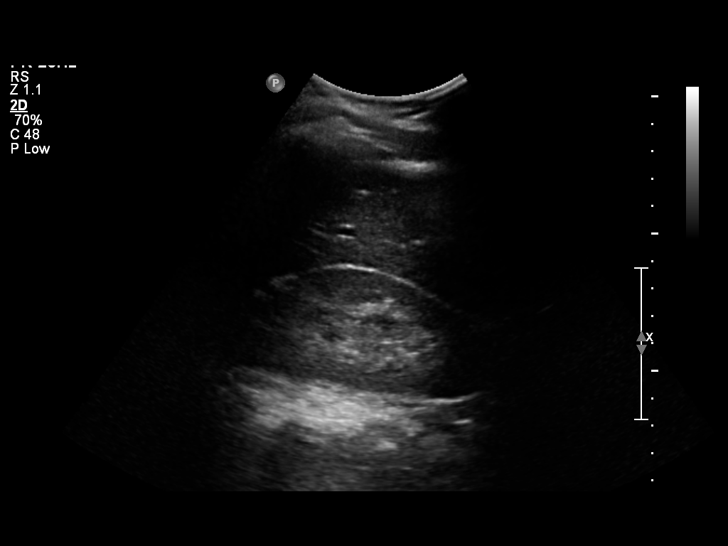
[im 8/32]
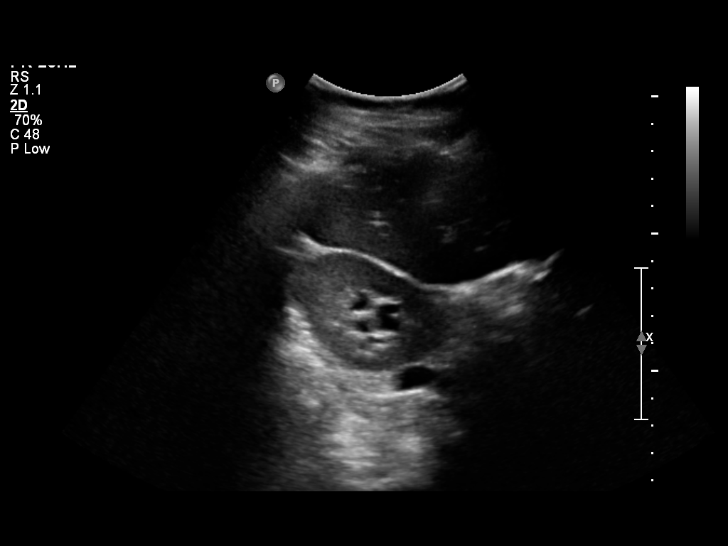
[im 11/32]
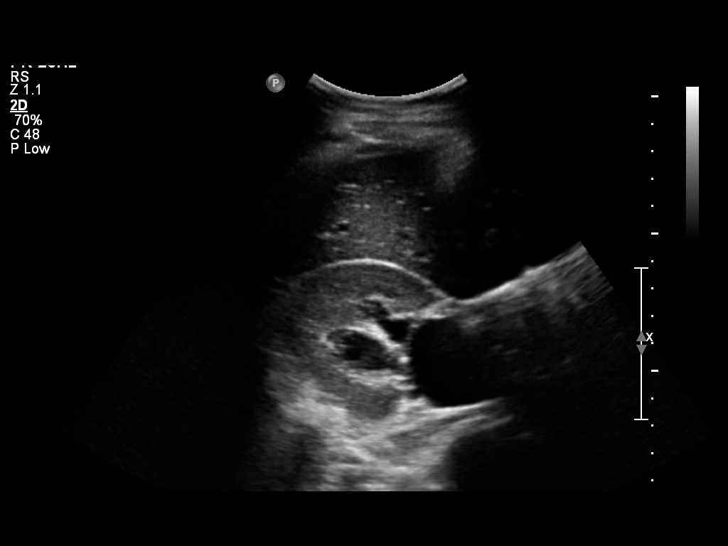
[im 12/32]
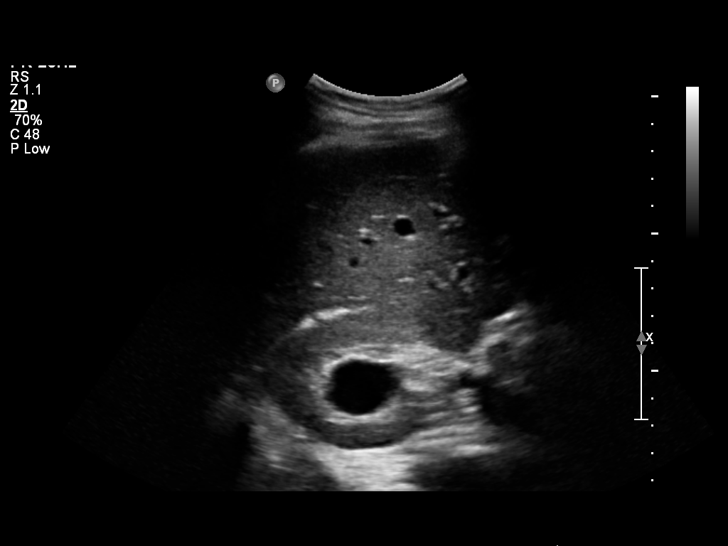
[im 15/32]
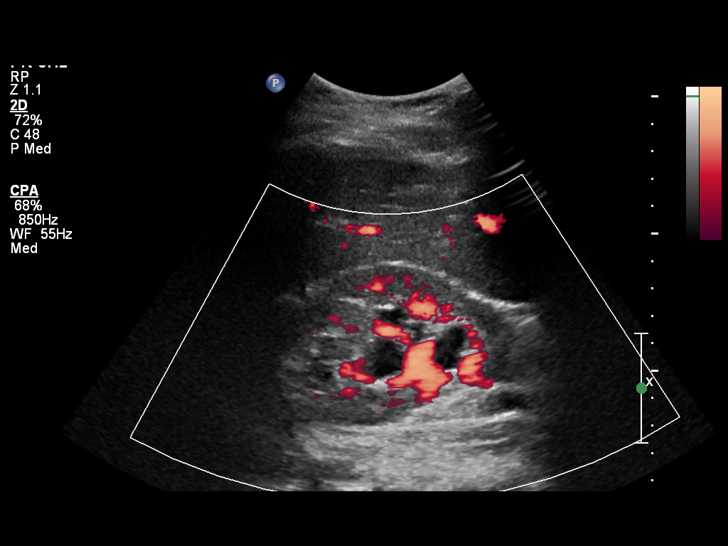
[im 17/32]
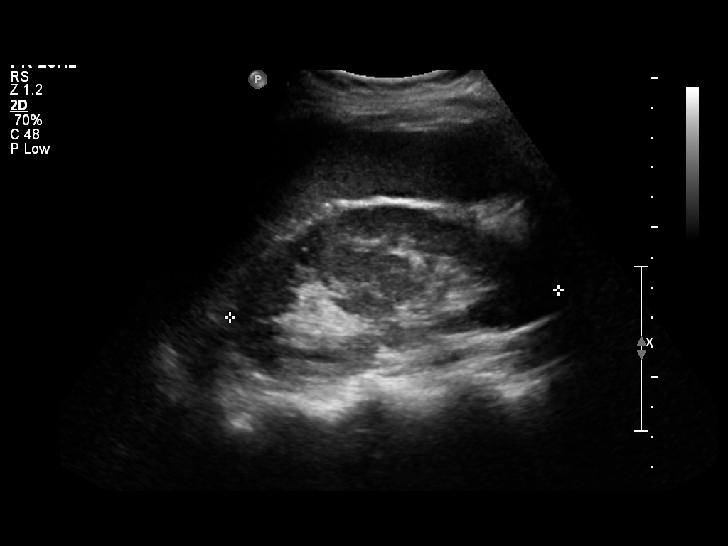
[im 20/32]
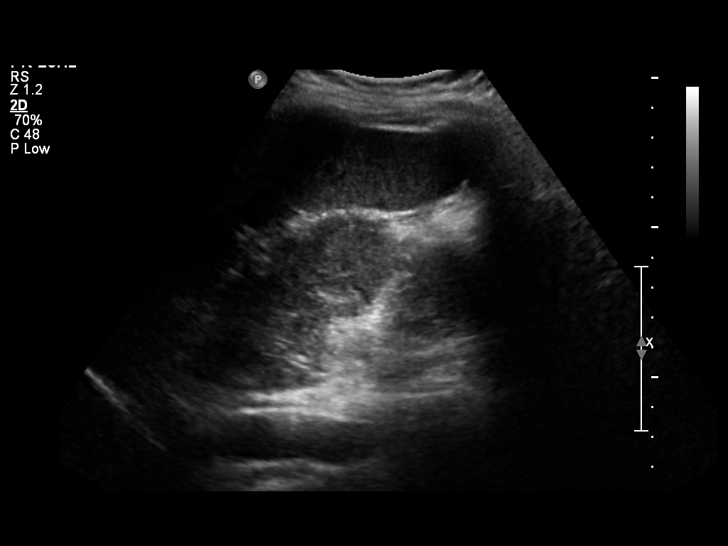
[im 21/32]
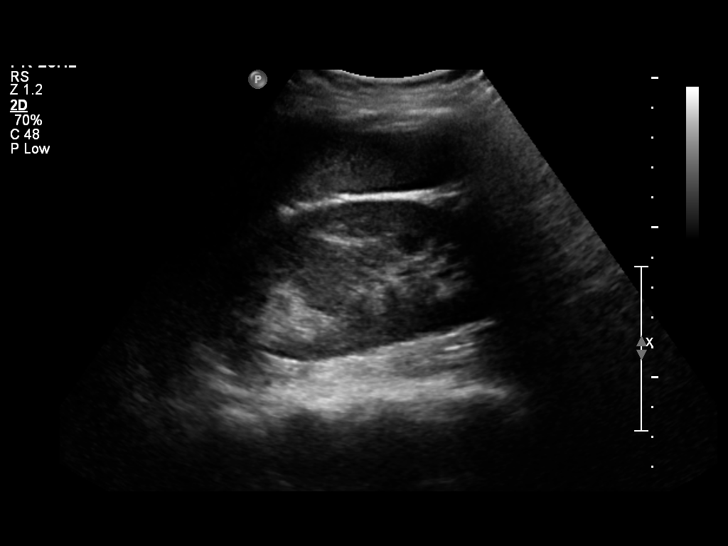
[im 24/32]
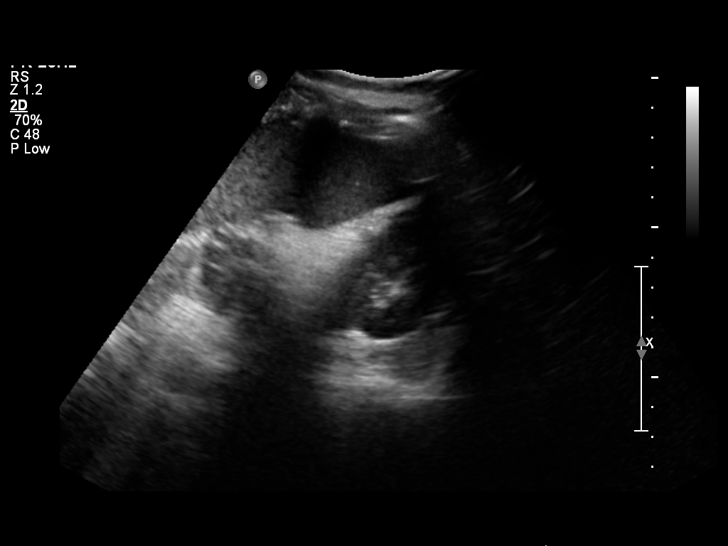
[im 26/32]
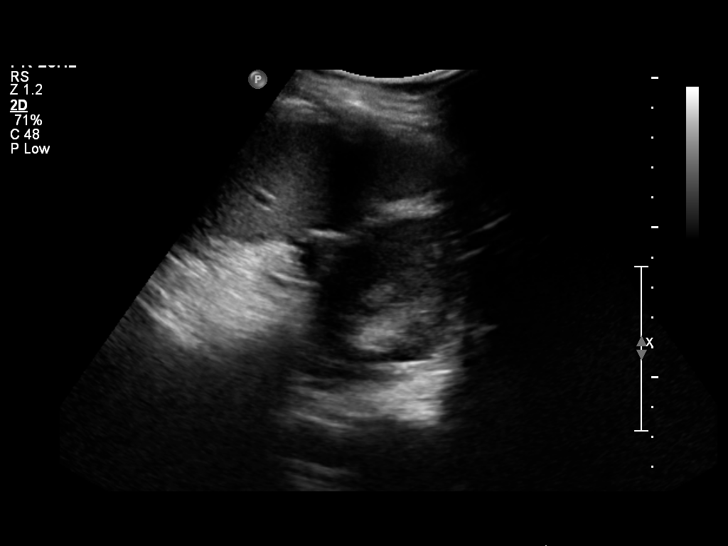
[im 29/32]
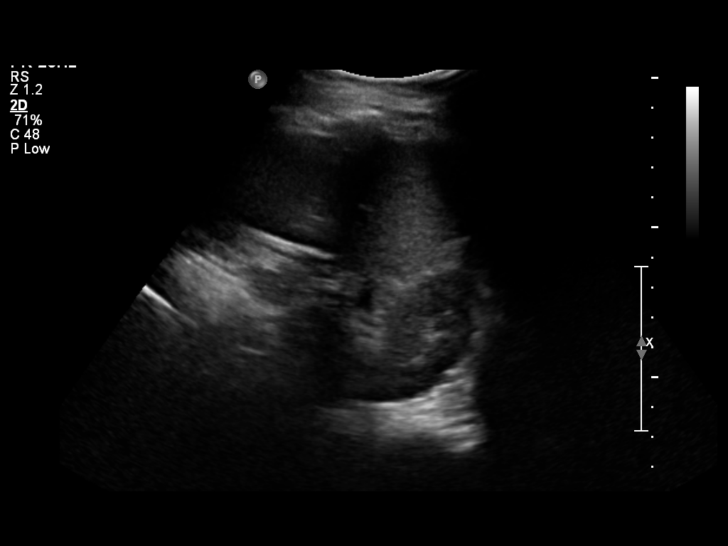
[im 32/32]
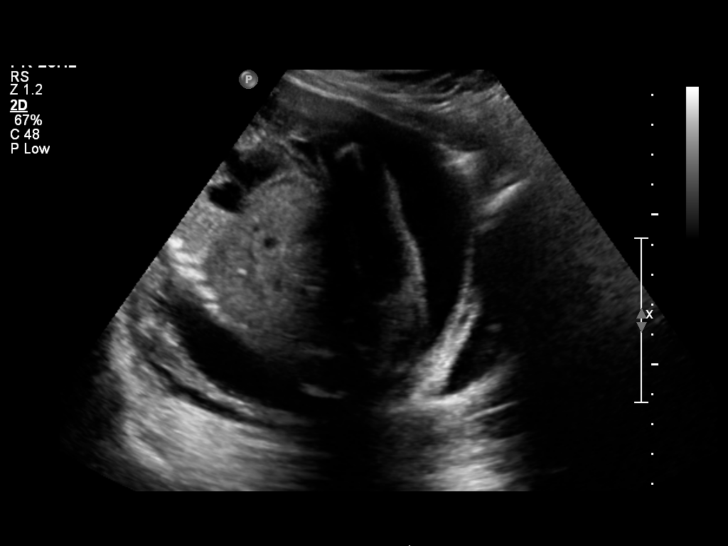

[14 of 25 positions shown; findings below may reference images not displayed]

FINDINGS: Right Kidney:

Length: 12.2 mm. Moderate right renal hydronephrosis with
pyelocaliectasis and proximal ureterectasis. Mid/distal ureter are
not visualized. Pelvis could be related to compression by the
pregnancy, the appearance is similar to previous study may represent
chronic hydronephrosis or reflux disease.

Left Kidney:

Length: 11 cm. Echogenicity within normal limits. No mass or
hydronephrosis visualized.

Bladder:

Bladder is decompressed and not well evaluated.
IMPRESSION: Moderate right renal hydronephrosis, similar prior study. Left
kidney is normal.
